# Patient Record
Sex: Female | Born: 1965 | Race: White | Hispanic: No | Marital: Married | Smoking: Never smoker
Health system: Southern US, Community
[De-identification: ages and names within clinical notes are randomized; demographics above are authoritative.]

## PROBLEM LIST (undated history)

## (undated) DIAGNOSIS — E7800 Pure hypercholesterolemia, unspecified: Secondary | ICD-10-CM

## (undated) DIAGNOSIS — I059 Rheumatic mitral valve disease, unspecified: Secondary | ICD-10-CM

## (undated) DIAGNOSIS — Z8619 Personal history of other infectious and parasitic diseases: Secondary | ICD-10-CM

## (undated) DIAGNOSIS — N39498 Other specified urinary incontinence: Secondary | ICD-10-CM

## (undated) HISTORY — PX: INDUCED ABORTION: SHX677

## (undated) HISTORY — DX: Rheumatic mitral valve disease, unspecified: I05.9

## (undated) HISTORY — PX: OTHER SURGICAL HISTORY: SHX169

## (undated) HISTORY — DX: Pure hypercholesterolemia, unspecified: E78.00

## (undated) HISTORY — PX: COLONOSCOPY: SHX174

## (undated) HISTORY — DX: Other specified urinary incontinence: N39.498

## (undated) HISTORY — DX: Personal history of other infectious and parasitic diseases: Z86.19

---

## 2009-02-09 ENCOUNTER — Ambulatory Visit: Payer: Self-pay | Admitting: Family Medicine

## 2009-02-09 DIAGNOSIS — Z9189 Other specified personal risk factors, not elsewhere classified: Secondary | ICD-10-CM | POA: Insufficient documentation

## 2009-02-09 DIAGNOSIS — I059 Rheumatic mitral valve disease, unspecified: Secondary | ICD-10-CM | POA: Insufficient documentation

## 2009-02-09 DIAGNOSIS — E7800 Pure hypercholesterolemia, unspecified: Secondary | ICD-10-CM | POA: Insufficient documentation

## 2009-02-09 DIAGNOSIS — R5381 Other malaise: Secondary | ICD-10-CM | POA: Insufficient documentation

## 2009-02-09 DIAGNOSIS — N39498 Other specified urinary incontinence: Secondary | ICD-10-CM | POA: Insufficient documentation

## 2009-02-09 DIAGNOSIS — R5383 Other fatigue: Secondary | ICD-10-CM | POA: Insufficient documentation

## 2009-02-11 ENCOUNTER — Ambulatory Visit: Payer: Self-pay | Admitting: Family Medicine

## 2009-02-12 LAB — CONVERTED CEMR LAB
ALT: 12 units/L (ref 0–35)
AST: 14 units/L (ref 0–37)
Albumin: 4.2 g/dL (ref 3.5–5.2)
Alkaline Phosphatase: 56 units/L (ref 39–117)
Bilirubin, Direct: 0 mg/dL (ref 0.0–0.3)
CO2: 24 meq/L (ref 19–32)
Calcium: 9.1 mg/dL (ref 8.4–10.5)
Chloride: 105 meq/L (ref 96–112)
Glucose, Bld: 86 mg/dL (ref 70–99)
HDL: 45.3 mg/dL (ref >39.00)
Sodium: 141 meq/L (ref 135–145)
Total CHOL/HDL Ratio: 3
Total Protein: 7 g/dL (ref 6.0–8.3)

## 2009-03-09 ENCOUNTER — Telehealth: Payer: Self-pay | Admitting: Family Medicine

## 2009-05-17 ENCOUNTER — Emergency Department: Payer: Self-pay | Admitting: Emergency Medicine

## 2009-05-20 ENCOUNTER — Encounter: Admission: RE | Admit: 2009-05-20 | Discharge: 2009-05-20 | Payer: Self-pay | Admitting: Family Medicine

## 2009-05-20 ENCOUNTER — Ambulatory Visit: Payer: Self-pay | Admitting: Family Medicine

## 2009-05-20 ENCOUNTER — Telehealth: Payer: Self-pay | Admitting: Family Medicine

## 2009-05-20 DIAGNOSIS — IMO0002 Reserved for concepts with insufficient information to code with codable children: Secondary | ICD-10-CM | POA: Insufficient documentation

## 2009-05-20 LAB — CONVERTED CEMR LAB
Basophils Absolute: 0.1 10*3/uL (ref 0.0–0.1)
Basophils Relative: 0.6 % (ref 0.0–3.0)
Eosinophils Absolute: 0.1 10*3/uL (ref 0.0–0.7)
Lymphocytes Relative: 27.6 % (ref 12.0–46.0)
MCHC: 33.4 g/dL (ref 30.0–36.0)
MCV: 88.8 fL (ref 78.0–100.0)
Monocytes Absolute: 0.6 10*3/uL (ref 0.1–1.0)
Neutrophils Relative %: 65.8 % (ref 43.0–77.0)
Platelets: 239 10*3/uL (ref 150.0–400.0)
RDW: 13.2 % (ref 11.5–14.6)

## 2009-05-21 ENCOUNTER — Encounter: Payer: Self-pay | Admitting: Family Medicine

## 2009-05-21 ENCOUNTER — Telehealth: Payer: Self-pay | Admitting: Family Medicine

## 2009-05-28 ENCOUNTER — Ambulatory Visit: Payer: Self-pay | Admitting: Family Medicine

## 2009-05-29 ENCOUNTER — Telehealth: Payer: Self-pay | Admitting: Family Medicine

## 2009-06-04 ENCOUNTER — Telehealth: Payer: Self-pay | Admitting: Family Medicine

## 2009-07-22 ENCOUNTER — Ambulatory Visit: Payer: Self-pay | Admitting: Family Medicine

## 2009-07-22 DIAGNOSIS — M25529 Pain in unspecified elbow: Secondary | ICD-10-CM | POA: Insufficient documentation

## 2009-07-22 LAB — CONVERTED CEMR LAB
BUN: 15 mg/dL (ref 6–23)
Basophils Relative: 0.3 % (ref 0.0–3.0)
Chloride: 109 meq/L (ref 96–112)
Eosinophils Relative: 0.7 % (ref 0.0–5.0)
GFR calc non Af Amer: 82.49 mL/min (ref >60)
HCT: 40.1 % (ref 36.0–46.0)
Lymphs Abs: 2.9 10*3/uL (ref 0.7–4.0)
Monocytes Relative: 5.6 % (ref 3.0–12.0)
Neutrophils Relative %: 60 % (ref 43.0–77.0)
Platelets: 211 10*3/uL (ref 150.0–400.0)
Potassium: 3.9 meq/L (ref 3.5–5.1)
RBC: 4.53 M/uL (ref 3.87–5.11)
Sed Rate: 24 mm/hr — ABNORMAL HIGH (ref 0–22)
WBC: 8.7 10*3/uL (ref 4.5–10.5)

## 2009-07-29 ENCOUNTER — Encounter: Admission: RE | Admit: 2009-07-29 | Discharge: 2009-07-29 | Payer: Self-pay | Admitting: Family Medicine

## 2009-07-30 DIAGNOSIS — M7020 Olecranon bursitis, unspecified elbow: Secondary | ICD-10-CM | POA: Insufficient documentation

## 2009-08-03 ENCOUNTER — Telehealth: Payer: Self-pay | Admitting: Family Medicine

## 2009-08-12 ENCOUNTER — Encounter: Payer: Self-pay | Admitting: Family Medicine

## 2009-09-03 ENCOUNTER — Telehealth: Payer: Self-pay | Admitting: Family Medicine

## 2009-09-14 ENCOUNTER — Ambulatory Visit: Payer: Self-pay | Admitting: Family Medicine

## 2009-09-14 ENCOUNTER — Encounter: Payer: Self-pay | Admitting: Family Medicine

## 2009-11-23 ENCOUNTER — Encounter: Payer: Self-pay | Admitting: Family Medicine

## 2009-12-03 ENCOUNTER — Telehealth: Payer: Self-pay | Admitting: Family Medicine

## 2010-01-01 ENCOUNTER — Ambulatory Visit: Payer: Self-pay | Admitting: Family Medicine

## 2010-01-01 DIAGNOSIS — R1011 Right upper quadrant pain: Secondary | ICD-10-CM | POA: Insufficient documentation

## 2010-01-01 LAB — CONVERTED CEMR LAB
Albumin: 4.2 g/dL (ref 3.5–5.2)
Basophils Relative: 0.4 % (ref 0.0–3.0)
CO2: 28 meq/L (ref 19–32)
Chloride: 102 meq/L (ref 96–112)
Eosinophils Absolute: 0.1 10*3/uL (ref 0.0–0.7)
Glucose, Bld: 78 mg/dL (ref 70–99)
Hemoglobin: 13.4 g/dL (ref 12.0–15.0)
Lipase: 32 units/L (ref 11.0–59.0)
Lymphs Abs: 3.1 10*3/uL (ref 0.7–4.0)
MCHC: 33.9 g/dL (ref 30.0–36.0)
MCV: 86.3 fL (ref 78.0–100.0)
Monocytes Absolute: 0.6 10*3/uL (ref 0.1–1.0)
Neutro Abs: 5.1 10*3/uL (ref 1.4–7.7)
RBC: 4.56 M/uL (ref 3.87–5.11)
Sodium: 136 meq/L (ref 135–145)
Total Protein: 7 g/dL (ref 6.0–8.3)

## 2010-01-05 ENCOUNTER — Encounter: Admission: RE | Admit: 2010-01-05 | Discharge: 2010-01-05 | Payer: Self-pay | Admitting: Family Medicine

## 2010-01-12 ENCOUNTER — Telehealth: Payer: Self-pay | Admitting: Family Medicine

## 2010-01-21 ENCOUNTER — Telehealth: Payer: Self-pay | Admitting: Family Medicine

## 2010-01-28 ENCOUNTER — Ambulatory Visit: Payer: Self-pay | Admitting: Cardiology

## 2010-03-15 ENCOUNTER — Ambulatory Visit: Payer: Self-pay | Admitting: Family Medicine

## 2010-03-15 DIAGNOSIS — R05 Cough: Secondary | ICD-10-CM

## 2010-03-15 DIAGNOSIS — R059 Cough, unspecified: Secondary | ICD-10-CM | POA: Insufficient documentation

## 2010-06-01 NOTE — Progress Notes (Signed)
Summary: Face and small area on chest flushed & red  Phone Note Call from Patient Call back at [phone]   Caller: Patient Call For: Dr. Patsy Lager Summary of Call: Pt saw Dr. Ermalene Searing on 05/20/09 for cellulitis of left elbow. Pt was given rocephin injection while here and then pt got Bactrim rx filled and since yesterday has taken a total of 6 Bactrim pills.(not at the same time). This morning pt said her face and small area on chest is flushed and appears red with no hives or rash and no itching but pt wonders if could be reaction to one of the meds. Pt states not having trouble breathing or SOB. Pt said the left elbow appears to be improving. Please advise.  Pt uses [facility] on [address] if pharmacy needed. Initial call taken by: Lewanda Rife LPN,  May 21, 2009 11:20 AM  Follow-up for Phone Call        possible to bactrim  change to below  call in Follow-up by: Hannah Beat MD,  May 21, 2009 11:25 AM  Additional Follow-up for Phone Call Additional follow up Details #1::        Patient advised.Consuello Masse CMA  Additional Follow-up by: Benny Lennert CMA Duncan Dull),  May 21, 2009 11:41 AM    New/Updated Medications: DOXYCYCLINE HYCLATE 100 MG CAPS (DOXYCYCLINE HYCLATE) Take 1 tab twice a day Prescriptions: DOXYCYCLINE HYCLATE 100 MG CAPS (DOXYCYCLINE HYCLATE) Take 1 tab twice a day  #20 x 0   Entered by:   Benny Lennert CMA (AAMA)   Authorized by:   Hannah Beat MD   Signed by:   Benny Lennert CMA (AAMA) on 05/21/2009   Method used:   Electronically to        [facility] [address] [phone]* (retail)       [address] [place]  [address]       Ph: [phone]       Fax: [phone]   RxID:   [id number]

## 2010-06-01 NOTE — Progress Notes (Signed)
Summary: When to start Bactrim DS  Phone Note Call from Patient Call back at [phone]   Caller: Patient Call For: Dr. Ermalene Searing Summary of Call: Pt saw Dr. Ermalene Searing this morning. Pt had taken an antibiotic at 9:oo am today. Was given an antibiotic injection while at office today. Pt wonders when should she start the prescription of Bactrim DS since she had taken antibiotic orally this AM and got antibiotic injection. Please advise.  Initial call taken by: Lewanda Rife LPN,  May 20, 2009 2:29 PM  Follow-up for Phone Call        patient advised that dr Ermalene Searing wanted her to go ahead and start med now Follow-up by: Benny Lennert CMA Duncan Dull),  May 20, 2009 2:31 PM

## 2010-06-01 NOTE — Progress Notes (Signed)
Summary: yeast infection   Phone Note Call from Patient Call back at Home Phone [phone]   Caller: Patient Call For: Hannah Beat MD Summary of Call: Patient says that she has just finished up with her antibiotics for her cellulitis and now has yeast infection. Wants to know if she needs to take something OTC or if something can be called in for her to [facility]. Please advise. Initial call taken by: Melody Comas,  June 04, 2009 11:27 AM  Follow-up for Phone Call        some is her preference. most of the otc creams will work 99% of the time.   if she wants to use creams, Monistat works fine  if she prefers oral med, ok to call in diflucan 150 mg, 1 by mouth x 1, 1 refill Follow-up by: Hannah Beat MD,  June 04, 2009 1:20 PM  Additional Follow-up for Phone Call Additional follow up Details #1::        Rx Called In, patient notified and prefered the pill instead of the creams Additional Follow-up by: Linde Gillis CMA Duncan Dull),  June 04, 2009 3:06 PM    New/Updated Medications: DIFLUCAN 150 MG TABS (FLUCONAZOLE) take one tablet by mouth as directed  Prior Medications: LIPITOR 10 MG TABS (ATORVASTATIN CALCIUM) take one a day CEPHALEXIN 500 MG CAPS (CEPHALEXIN) 1 by mouth 4 times daily DOXYCYCLINE HYCLATE 100 MG CAPS (DOXYCYCLINE HYCLATE) Take 1 tab twice a day Current Allergies: No known allergies

## 2010-06-01 NOTE — Assessment & Plan Note (Signed)
Summary: 10:00 ?GALLBLADDER/CLE   Vital Signs:  Patient profile:   45 year old female Height:      68 inches Weight:      188.0 pounds BMI:     28.69 Temp:     97.6 degrees F oral Pulse rate:   76 / minute Pulse rhythm:   regular BP sitting:   118 / 80  (left arm) Cuff size:   regular  Vitals Entered By: Benny Lennert CMA Duncan Dull) (January 01, 2010 10:07 AM)  History of Present Illness: Chief complaint ? gallbladder  9-10 days ago began having abdominal pain Dull pain, continuous right upper quadrant. No sharp apin. 7/10...keeping her up at night.  No change with movement, no change with eating, defecation. No pain with riding in car.  Recetn GYN exam...has fibroids. HAs hx of umbilical hernia, inrepaired.  Problems Prior to Update: 1)  Unspecified Breast Screening  (ICD-V76.10) 2)  Olecranon Bursitis  (ICD-726.33) 3)  Elbow Pain, Left  (ICD-719.42) 4)  Cellulitis and Abscess of Upper Arm and Forearm  (ICD-682.3) 5)  Screening For Diabetes Mellitus  (ICD-V77.1) 6)  Fatigue  (ICD-780.79) 7)  Family History of Colon Ca 1st Degree Relative <60  (ICD-V16.0) 8)  Mitral Valve Prolapse  (ICD-424.0) 9)  Stress Incontinence  (ICD-788.39) 10)  Hypercholesterolemia  (ICD-272.0) 11)  Chickenpox, Hx of  (ICD-V15.9)  Current Medications (verified): 1)  Lipitor 10 Mg Tabs (Atorvastatin Calcium) .... Take One A Day  Allergies (verified): No Known Drug Allergies  Past History:  Past medical, surgical, family and social histories (including risk factors) reviewed, and no changes noted (except as noted below).  Past Medical History: Reviewed history from 02/09/2009 and no changes required. MITRAL VALVE PROLAPSE (ICD-424.0) STRESS INCONTINENCE (ICD-788.39) HYPERCHOLESTEROLEMIA (ICD-272.0) CHICKENPOX, HX OF (ICD-V15.9)    Past Surgical History: Reviewed history from 02/09/2009 and no changes required. 2 c-sections Abortions x 2   Family History: Reviewed history from  02/09/2009 and no changes required. Family History of Colon CA 1st degree relative <60 Family History High cholesterol Family History of Cardiovascular disorder  F, CAD in 24's, now alive in 45's M: alive, HTN MGF: died from Melanoma  Social History: Reviewed history from 02/09/2009 and no changes required. 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 CV:  Denies chest pain or discomfort. Resp:  Denies shortness of breath. GI:  Complains of diarrhea; denies bloody stools, constipation, gas, hemorrhoids, indigestion, and nausea; one episode of diarrhea after ice cream in last few days. GU:  Denies abnormal vaginal bleeding and dysuria.  Physical Exam  General:  Well-developed,well-nourished,in no acute distress; alert,appropriate and cooperative throughout examination Mouth:  Oral mucosa and oropharynx without lesions or exudates.  Teeth in good repair. Neck:  no carotid bruit or thyromegaly no cervical or supraclavicular lymphadenopathy  Lungs:  Normal respiratory effort, chest expands symmetrically. Lungs are clear to auscultation, no crackles or wheezes. Heart:  Normal rate and regular rhythm. S1 and S2 normal without gallop, murmur, click, rub or other extra sounds. Abdomen:  ttp in RUQ, mild, no rebound no guarding. small umbilical hernia, enlarged uterus, firm, tender to palpation central low abdomen chronic NABSno hepatomegaly and no splenomegaly.     Impression & Recommendations:  Problem # 1:  ABDOMINAL PAIN, RIGHT UPPER QUADRANT (ICD-789.01) Possible liver issue versus gallbladder source. Pt is on statin. Eval with labs and [place].  Orders: TLB-BMP (Basic Metabolic  Panel-BMET) (80048-METABOL) TLB-CBC Platelet - w/Differential (85025-CBCD) TLB-Hepatic/Liver Function Pnl (80076-HEPATIC) TLB-Lipase (83690-LIPASE) Radiology Referral  (Radiology)  Complete Medication List: 1)  Lipitor 10 Mg Tabs (Atorvastatin calcium) .... Take one a day  Patient Instructions: 1)  Referral Appointment Information 2)  Day/Date: 3)  Time: 4)  Place/MD: 5)  Address: 6)  Phone/Fax: 7)  Patient given appointment information. Information/Orders faxed/mailed. 8)  Go to ER if severe pain, fever, uncontrollable N/V.  Current Allergies (reviewed today): No known allergies

## 2010-06-01 NOTE — Progress Notes (Signed)
Summary: mammogram  Phone Note Call from Patient Call back at Home Phone [phone]   Caller: Patient Call For: Hannah Beat MD Summary of Call: Patient calling wanting mammogram order to  [facility] [phone]  Patient would like this done in the am if possible with in the next 2 weeks Initial call taken by: Benny Lennert CMA Duncan Dull),  Sep 03, 2009 11:33 AM     Appended Document: mammogram    Clinical Lists Changes  Problems: Added new problem of UNSPECIFIED BREAST SCREENING (ICD-V76.10) Orders: Added new Referral order of Radiology Referral (Radiology) - Signed

## 2010-06-01 NOTE — Progress Notes (Signed)
Summary: Stomach burning  Phone Note Call from Patient Call back at Home Phone [phone]   Caller: Patient Call For: Hannah Beat MD Summary of Call: Patient called stating she saw Dr. Patsy Lager yesterday and was treated for Cellulitis in her left elbow.  She is still having pain in that elbow along with a lot of stomach burning.  She thinks that the Doxycycline 100mg  twice daily and the Cephalexin 500mg  four times daily is whats causing her stomach to feel like its burning.  Wants nurse to call her back.     Initial call taken by: Linde Gillis CMA Duncan Dull),  May 29, 2009 9:31 AM  Follow-up for Phone Call        Patient called back and wants to know what to do.  She is concerned that after she takes Cephalexin 500mg  for her first dose today that her stomach will start burning again.  She is very anxious and really wants a call back as soon as possible.  Please advise. Follow-up by: Linde Gillis CMA Duncan Dull),  May 29, 2009 12:01 PM  Additional Follow-up for Phone Call Additional follow up Details #1::        Let pt know if nausea..can call in antiemetic. Can take OTC prilosec 40 mg daily to decrease acid in stomach and help with stomach irritation.  Additional Follow-up by: Kerby Nora MD,  May 29, 2009 12:22 PM    Additional Follow-up for Phone Call Additional follow up Details #2::    Left message on patient's voicemail per patient's request, advised as instructed. Follow-up by: Linde Gillis CMA Duncan Dull),  May 29, 2009 12:28 PM

## 2010-06-01 NOTE — Assessment & Plan Note (Signed)
Summary: F/U CELLULITIS/CLE   Vital Signs:  Patient profile:   45 year old female Height:      68 inches Weight:      188.2 pounds BMI:     28.72 Temp:     98.1 degrees F oral Pulse rate:   76 / minute Pulse rhythm:   regular BP sitting:   110 / 60  (left arm) Cuff size:   regular  Vitals Entered By: Benny Lennert CMA Duncan Dull) (May 28, 2009 8:24 AM)  History of Present Illness: Chief complaint follow up cellulitis  pleasant 45 year old female well known to me he follows up the left elbow cellulitis. She initially went to the emergency room and was placed on some Septra DS, subtly followed up with Dr. Ermalene Searing was placed on Keflex 500 mg p.o. 4 times daily.  She ended up developing some rash, so we discontinued her Septra, and continued oral doxycycline. She has had a dramatic improvement in her cellulitis and elbow pain. She still has some pain with terminal motion in extreme flexion and terminal supination pronation, she has no palpable like for non-bursitis currently. There is minimal puffiness.  Both historical note, proximally 4 months ago she had a left-sided olecranon bursitis, [facility], had her elbow aspirated and injected with corticosteroid. She had no immediate infectious response status post injection. She did have some persistent olecranon bursitis after this however.  Allergies (verified): No Known Drug Allergies  Past History:  Past medical, surgical, family and social histories (including risk factors) reviewed, and no changes noted (except as noted below).  Past Medical History: Reviewed history from 02/09/2009 and no changes required. MITRAL VALVE PROLAPSE (ICD-424.0) STRESS INCONTINENCE (ICD-788.39) HYPERCHOLESTEROLEMIA (ICD-272.0) CHICKENPOX, HX OF (ICD-V15.9)    Past Surgical History: Reviewed history from 02/09/2009 and no changes required. 2 c-sections Abortions x 2   Family History: Reviewed history from  02/09/2009 and no changes required. Family History of Colon CA 1st degree relative <60 Family History High cholesterol Family History of Cardiovascular disorder  F, CAD in 81's, now alive in 25's M: alive, HTN MGF: died from Melanoma  Social History: Reviewed history from 02/09/2009 and no changes required. 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 CV: No chest pain or SOB GI: No noted N or V skin much better Otherwise, pertinent positives and negatives are noted in the HPI.  MS:  See HPI.  Physical Exam  General:  GEN: Well-developed,well-nourished,in no acute distress; alert,appropriate and cooperative throughout examination HEENT: Normocephalic and atraumatic without obvious abnormalities. No apparent alopecia or balding. Ears, externally no deformities PULM: Breathing comfortably in no respiratory distress EXT: No clubbing, cyanosis, or edema PSYCH: Normally interactive. Cooperative during the interview. Pleasant. Friendly and conversant. Not anxious or depressed appearing. Normal, full affect.  Msk:  left elbow is not read, there is no significant olecranon bursitis. She has a full range of motion. There is no redness or warmth surrounding the joint. Still has some mild pain with percussion of the olecranon. Mild to minimal pain with terminal pronation and supination.   Impression & Recommendations:  Problem # 1:  CELLULITIS AND ABSCESS OF UPPER ARM AND FOREARM (ICD-682.3) Assessment Improved dramatically improved. Given location of infection, and some mild pain, and then extend her antibiotics. I think this is most likely persistent mild postinflammatory state around the  elbow, however given potential morbidity for persistent infection will extend abx.  The following medications were removed from the medication  list:    Keflex 500 Mg Caps (Cephalexin) .Marland Kitchen... Take one tablet 4 times daily for 10 days Her updated medication list for this problem includes:    Doxycycline Hyclate 100 Mg Caps (Doxycycline hyclate) .Marland Kitchen... Take 1 tab twice a day    Cephalexin 500 Mg Caps (Cephalexin) .Marland Kitchen... 1 by mouth 4 times daily  Complete Medication List: 1)  Lipitor 10 Mg Tabs (Atorvastatin calcium) .... Take one a day 2)  Doxycycline Hyclate 100 Mg Caps (Doxycycline hyclate) .... Take 1 tab twice a day 3)  Cephalexin 500 Mg Caps (Cephalexin) .Marland Kitchen.. 1 by mouth 4 times daily 4)  Doxycycline Hyclate 100 Mg Caps (Doxycycline hyclate) .... Take 1 tab twice a day Prescriptions: DOXYCYCLINE HYCLATE 100 MG CAPS (DOXYCYCLINE HYCLATE) Take 1 tab twice a day  #20 x 0   Entered and Authorized by:   Hannah Beat MD   Signed by:   Hannah Beat MD on 05/28/2009   Method used:   Electronically to        [facility] [address] [phone]* (retail)       [address] [place], [place]  [address]       Ph: [phone]       Fax: [phone]   RxID:   [id number] CEPHALEXIN 500 MG CAPS (CEPHALEXIN) 1 by mouth 4 times daily  #40 x 0   Entered and Authorized by:   Hannah Beat MD   Signed by:   Hannah Beat MD on 05/28/2009   Method used:   Electronically to        [facility] [address] [phone]* (retail)       [address] [place]  [address]       Ph: [phone]       Fax: [phone]   RxID:   [id number]   Current Allergies (reviewed today): No known allergies

## 2010-06-01 NOTE — Consult Note (Signed)
Summary: [facility]  [facility]   Imported By: Lanelle Bal 09/16/2009 12:09:08  _____________________________________________________________________  External Attachment:    Type:   Image     Comment:   External Document

## 2010-06-01 NOTE — Progress Notes (Signed)
Summary: ? about medications  Phone Note Call from Patient Call back at Home Phone [phone]   Caller: Patient Call For: Hannah Beat MD Summary of Call: Patient called and wants a return call.  Has several questions about what medications she has already taken this morning, and the new medication that Dr. Ermalene Searing prescribed for her.   Initial call taken by: Linde Gillis CMA Duncan Dull),  May 20, 2009 12:46 PM  Follow-up for Phone Call        Sorry..made mistake..resent as generic # 40  Directions correct. Please let pharm know.   Please call pt to find out what her questions are.  Follow-up by: Kerby Nora MD,  May 20, 2009 12:56 PM  Additional Follow-up for Phone Call Additional follow up Details #1::        Patient advised.Consuello Masse CMA  Additional Follow-up by: Benny Lennert CMA Duncan Dull),  May 20, 2009 2:19 PM    New/Updated Medications: SULFAMETHOXAZOLE-TMP DS 800-160 MG TABS (SULFAMETHOXAZOLE-TRIMETHOPRIM) 2 tab by mouth two times a day x 10 days Prescriptions: SULFAMETHOXAZOLE-TMP DS 800-160 MG TABS (SULFAMETHOXAZOLE-TRIMETHOPRIM) 2 tab by mouth two times a day x 10 days  #40 x 0   Entered and Authorized by:   Kerby Nora MD   Signed by:   Kerby Nora MD on 05/20/2009   Method used:   Electronically to        [facility] [address] [phone]* (retail)       [address] [place]  [address]       Ph: [phone]       Fax: [phone]   RxID:   [id number]

## 2010-06-01 NOTE — Progress Notes (Signed)
Summary: requests diflucan  Phone Note Call from Patient Call back at Home Phone [phone]   Caller: Patient Summary of Call: Pt is going on vacation tomorrow and has a yeast infection.  She is asking that [facility] be [facility] [place].  She is asking please. Initial call taken by: Lowella Petties CMA,  December 03, 2009 8:12 AM  Follow-up for Phone Call        HAs she been on recetn antibitoics....if so i will send in diflucan.  if no...needs appt to have vaginal exam to make sure no other source of symptoms.  Follow-up by: Kerby Nora MD,  December 03, 2009 8:22 AM  Additional Follow-up for Phone Call Additional follow up Details #1::        patient was not on antibiotics but, she says that she changed her famine pads and the ones she changed to ones with fragrance and she said that is what cause it.Consuello Masse CMA   Additional Follow-up by: Benny Lennert CMA Duncan Dull),  December 03, 2009 9:42 AM    Additional Follow-up for Phone Call Additional follow up Details #2::    Dr. Patsy Lager has called in diflucan for her in past with sucess..Let her know I will do this once, but in furture she will need appt ...she needs to make an appt if it is not resolving.  Follow-up by: Kerby Nora MD,  December 03, 2009 11:01 AM  Additional Follow-up for Phone Call Additional follow up Details #3:: Details for Additional Follow-up Action Taken: Patient advised.Consuello Masse CMA    Additional Follow-up by: Benny Lennert CMA Duncan Dull),  December 03, 2009 11:03 AM  New/Updated Medications: FLUCONAZOLE 150 MG TABS (FLUCONAZOLE) 1 tab by mouth x 1 Prescriptions: FLUCONAZOLE 150 MG TABS (FLUCONAZOLE) 1 tab by mouth x 1  #1 x 0   Entered and Authorized by:   Kerby Nora MD   Signed by:   Kerby Nora MD on 12/03/2009   Method used:   Electronically to        [facility] [address] [phone]* (retail)       [address], [place]  [address]       Ph: [phone]       Fax:  [phone]   RxID:   [id number]   Appended Document: requests diflucan Pt called back, said she had a bad connection on her call with Herbert Seta and she lost the call, called back to see if medicine was going to be called in- advised pt that medicine had been called in and advised her of instructions.

## 2010-06-01 NOTE — Progress Notes (Signed)
Summary: ct scan   Phone Note Call from Patient Call back at Home Phone [phone]   Caller: Patient Call For: Kerby Nora MD Summary of Call: Patient wants to go ahead with the CT to check the liver on the lesion. Initial call taken by: Benny Lennert CMA Duncan Dull),  January 21, 2010 10:10 AM  Follow-up for Phone Call        CT Scan appt at [facility] on 01/28/2010 at 10:00am.   Follow-up by: Carlton Adam,  January 22, 2010 12:03 PM  New Problems: ? of HEPATIC CYST (ICD-573.8)   New Problems: ? of HEPATIC CYST (ICD-573.8)

## 2010-06-01 NOTE — Assessment & Plan Note (Signed)
Summary: COUGH/DLO   Vital Signs:  Patient profile:   45 year old female Height:      68 inches Weight:      179.50 pounds BMI:     27.39 O2 Sat:      96 % on Room air Temp:     98.6 degrees F oral Pulse rate:   80 / minute Pulse rhythm:   regular Resp:     16 per minute BP sitting:   102 / 80  (left arm) Cuff size:   regular  Vitals Entered By: Delilah Shan CMA Duncan Dull) (March 15, 2010 9:09 AM)  O2 Flow:  Room air CC: Cough, chest congestion   History of Present Illness: Cough.  "Really bad cough, persistent."  Delsym helps some.  No URI symptoms.  No ST.  Pain with cough.  pain with deep breath.  Started about 12 days ago.  Occ sputum in AM but then it clears up and she has a dry cough.  Hurts to laugh.  Sleep affected.  Sleep throughout the day and night.  No documented fevers now.  Occ exp wheeze, new for patient.    Allergies: No Known Drug Allergies  Review of Systems       See HPI.  Otherwise negative.    Physical Exam  General:  NAD ncat  mmm tm wnl b  nasal exam w/o acute change rrr no in in wob, exp wheeze w/o decrease in bs, ant chest wall tender to palpation  ext w/o edema   Impression & Recommendations:  Problem # 1:  COUGH (ICD-786.2) Likely postviral cough.  Nontoxic. d/w patient and okay for outpatient follow up.  Use SABA as needed in meantime.  Should gradually improve.  She understood.  no indication for antibiotics. SABA use reviewed with patient.   Complete Medication List: 1)  Lipitor 10 Mg Tabs (Atorvastatin calcium) .... Take one a day 2)  Proair Hfa 108 (90 Base) Mcg/act Aers (Albuterol sulfate) .Marland Kitchen.. 1-2 puffs q4hours as needed for cough  Patient Instructions: 1)  Try to get some rest, drink plenty of fluids and use the inhaler every 4 hours as needed for cough.  This should get better, but the change will likely be gradual.  Let us know if you have other concerns.   Prescriptions: PROAIR HFA 108 (90 BASE) MCG/ACT AERS (ALBUTEROL  SULFATE) 1-2 puffs q4hours as needed for cough  #1 x 2   Entered and Authorized by:   Crawford Givens MD   Signed by:   Crawford Givens MD on 03/15/2010   Method used:   Faxed to ...       [facility] (retail)       [address]       [place], [place]    [place]       Ph: [phone]       Fax: [phone]   RxID:   [id number] PROAIR HFA 108 [phone] BASE) MCG/ACT AERS (ALBUTEROL SULFATE) 1-2 puffs q4hours as needed for cough  #1 x 2   Entered and Authorized by:   Crawford Givens MD   Signed by:   Crawford Givens MD on 03/15/2010   Method used:   Print then Give to Patient   RxID:   [id number]    Orders Added: 1)  Est. Patient Level III [28[address]]    Current Allergies (reviewed today): No known allergies

## 2010-06-01 NOTE — Progress Notes (Signed)
Summary: regarding ultrasound results  Phone Note Call from Patient Call back at Home Phone [phone]   Caller: Patient Summary of Call: Pt is concerned about abd ultrasound results and she is asking what you recommend she do about the findings.  She says she is feeling much better with no more pain, but she is concerned about the spot on her kidney because her father had renal cancer.  Is this something that needs to be further checked now, or recheck in a few months? Initial call taken by: Lowella Petties CMA,  January 12, 2010 1:00 PM  Follow-up for Phone Call        Only thing on kideny was a benign appearing cyst. No worrisome features noted...no further work up needed.  Liver lesion...also begnign appearing...Marland Kitchenno further work up recommended given symptoms dod not correspond. If she is very worried/anxious we can eval liver lesion with CT now  (kidney lesion appears completely begnign) Follow-up by: Kerby Nora MD,  January 12, 2010 2:04 PM  Additional Follow-up for Phone Call Additional follow up Details #1::        Lmom.Consuello Masse CMA   Additional Follow-up by: Benny Lennert CMA Duncan Dull),  January 12, 2010 3:03 PM    Additional Follow-up for Phone Call Additional follow up Details #2::    Patient notified and is going to wait and watch for a year then may do something else.Consuello Masse CMA   Follow-up by: Benny Lennert CMA Duncan Dull),  January 12, 2010 3:10 PM

## 2010-06-01 NOTE — Assessment & Plan Note (Signed)
Summary: L ELBOW PAIN/CLE   Vital Signs:  Patient profile:   45 year old female Height:      68 inches Weight:      183.3 pounds BMI:     27.97 Temp:     98.4 degrees F oral Pulse rate:   76 / minute Pulse rhythm:   regular BP sitting:   120 / 78  (left arm) Cuff size:   regular  Vitals Entered By: Benny Lennert CMA Duncan Dull) (July 22, 2009 9:10 AM) CC: LEFT ELBOW PAIN   History of Present Illness: Chief complaint follow up cellulitis  pleasant 45 year old female well known to me he follows up the left elbow cellulitis. She initially went to the emergency room and was placed on some Septra DS, subtly followed up with Dr. Ermalene Searing was placed on Keflex 500 mg p.o. 4 times daily.  She ended up developing some rash, so we discontinued her Septra, and continued oral doxycycline.   she had negative plain x-rays, and did have an exam and x-rays consistent with olecranon bursitis. There is likely and secondary cellulitis, however the patient improved after Rocephin injection, Keflex, and then an extended course of Keflex and doxycycline  prior to this she had a left-sided olecranon bursitis, was seen at orthopedic, had her elbow aspirated and injected with corticosteroid. She had no immediate infectious response status post injection. She did have some persistent olecranon bursitis after this however.  Allergies (verified): No Known Drug Allergies  Past History:  Past medical, surgical, family and social histories (including risk factors) reviewed, and no changes noted (except as noted below).  Past Medical History: Reviewed history from 02/09/2009 and no changes required. MITRAL VALVE PROLAPSE (ICD-424.0) STRESS INCONTINENCE (ICD-788.39) HYPERCHOLESTEROLEMIA (ICD-272.0) CHICKENPOX, HX OF (ICD-V15.9)    Past Surgical History: Reviewed history from 02/09/2009 and no changes required. 2 c-sections Abortions x 2   Family History: Reviewed history from 02/09/2009 and no changes  required. Family History of Colon CA 1st degree relative <60 Family History High cholesterol Family History of Cardiovascular disorder  F, CAD in 48's, now alive in 48's M: alive, HTN MGF: died from Melanoma  Social History: Reviewed history from 02/09/2009 and no changes required. 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 Otherwise the pertinent positives of the ROS are noted above.    Physical Exam  General:  GEN: Well-developed,well-nourished,in no acute distress; alert,appropriate and cooperative throughout examination HEENT: Normocephalic and atraumatic without obvious abnormalities. No apparent alopecia or balding. Ears, externally no deformities PULM: Breathing comfortably in no respiratory distress EXT: No clubbing, cyanosis, or edema PSYCH: Normally interactive. Cooperative during the interview. Pleasant. Friendly and conversant. Not anxious or depressed appearing. Normal, full affect.    Shoulder/Elbow Exam  Skin:    Intact, no scars, lesions, rashes  Inspection:    slightly prominent olecranon, however the bursa is not boggy  Palpation:    minimally tender with percussion  Elbow Exam:    Right:    Inspection:  Normal    Palpation:  Normal    Stability:  stable    Tenderness:  no    Swelling:  no    Erythema:  no    Range of Motion:  Flexion-Active: 135       Extension-Active: 0       Flexion-Passive: 135       Extension-Passive: 0       Elbow Flexion: > 60 seconds    Left:    Inspection:  Normal    Palpation:  Normal    Stability:  stable    Tenderness:  left olecranon    Swelling:  no    Erythema:  no    Range  of Motion:       Flexion-Active: 135       Extension-Active: 0       Flexion-Passive: 135       Extension-Passive: 0       Elbow Flexion: > 60 seconds   Impression & Recommendations:  Problem # 1:  ELBOW PAIN, LEFT (ICD-719.42) Assessment Deteriorated history of protracted course with 2 extended courses of antibodies with cellulitis adjacent to the elbow and bursa. There does not appear to be any active cellulitis or bursitis currently clinically. However, patient does have some mild pain with terminal flexion, supination, and pronation.  I noted obtain an MRI with and without gadolinium to evaluate for potential osteomyelitis given these infectious processes. If this is the case, then evaluation by orthopedics and infectious disease is indicated.  This could be complex regional pain syndrome , reflex sympathetic dystrophy  Orders: Venipuncture (56213) TLB-BMP (Basic Metabolic Panel-BMET) (80048-METABOL) TLB-CBC Platelet - w/Differential (85025-CBCD) TLB-Sedimentation Rate (ESR) (85652-ESR) Radiology Referral (Radiology)  Problem # 2:  CELLULITIS AND ABSCESS OF UPPER ARM AND FOREARM (ICD-682.3) Assessment: Improved  The following medications were removed from the medication list:    Cephalexin 500 Mg Caps (Cephalexin) .Marland Kitchen... 1 by mouth 4 times daily  Orders: Venipuncture (08657) TLB-BMP (Basic Metabolic Panel-BMET) (80048-METABOL) TLB-CBC Platelet - w/Differential (85025-CBCD) TLB-Sedimentation Rate (ESR) (85652-ESR)  Complete Medication List: 1)  Lipitor 10 Mg Tabs (Atorvastatin calcium) .... Take one a day 2)  Lorazepam 0.5 Mg Tabs (Lorazepam) .Marland Kitchen.. 1 tab by mouth 30 minutes before mri, may repeat x 1 if needed  Patient Instructions: 1)  Referral Appointment Information 2)  Day/Date: 3)  Time: 4)  Place/MD: 5)  Address: 6)  Phone/Fax: 7)  Patient given appointment information. Information/Orders faxed/mailed.  Prescriptions: LORAZEPAM 0.5 MG TABS (LORAZEPAM) 1 tab by  mouth 30 minutes before MRI, may repeat x 1 if needed  #2 x 0   Entered and Authorized by:   Hannah Beat MD   Signed by:   Hannah Beat MD on 07/22/2009   Method used:   Print then Give to Patient   RxID:   [id number]   Current Allergies (reviewed today): No known allergies

## 2010-06-01 NOTE — Progress Notes (Signed)
Summary: regarding MRI report  Phone Note Call from Patient Call back at Home Phone [phone]   Caller: Patient Call For: Hannah Beat MD Summary of Call: Pt is going to see an ortho on 4/13 and she wants to make sure her MRI report will be there when she goes.  Do we need to send that?  Will she need to take the films?  How does that work? Initial call taken by: Lowella Petties CMA,  August 03, 2009 3:38 PM  Follow-up for Phone Call        they have access to films through computer Follow-up by: Hannah Beat MD,  August 03, 2009 3:49 PM  Additional Follow-up for Phone Call Additional follow up Details #1::        [facility] advising pt. Additional Follow-up by: Lowella Petties CMA,  August 03, 2009 4:34 PM

## 2010-06-01 NOTE — Letter (Signed)
Summary: [facility]   Imported By: Lanelle Bal 12/24/2009 08:37:08  _____________________________________________________________________  External Attachment:    Type:   Image     Comment:   External Document

## 2010-06-01 NOTE — Progress Notes (Signed)
Summary: Clarification on Bactrim  Phone Note From Pharmacy   Caller: [facility] [phone] Call For: Dr. Ermalene Searing  Summary of Call: Received call from pharmacy stating that they need clarification on Bactrim.  Do you want #40 tablets with directions being 2 tablets twice daily for 10 days, or would you like 1 tablet twice daily for 10 days #20? Initial call taken by: Linde Gillis CMA Duncan Dull),  May 20, 2009 11:46 AM  Follow-up for Phone Call        See other phone note. Correct is #40.  Follow-up by: Kerby Nora MD,  May 20, 2009 1:25 PM  Additional Follow-up for Phone Call Additional follow up Details #1::        all taken care of Additional Follow-up by: Benny Lennert CMA Duncan Dull),  May 20, 2009 2:20 PM

## 2010-06-01 NOTE — Progress Notes (Signed)
Summary: wants to change PCP's  Phone Note Call from Patient Call back at Home Phone [phone]   Caller: Patient Summary of Call: Pt would like to switch from Dr. Patsy Lager to Dr. Ermalene Searing as a PCP.  The only reason is that she prefers a female doctor.  She knows she will be seeing Dr. Patsy Lager in Dr. Daphine Deutscher absence. Initial call taken by: Lowella Petties CMA,  January 12, 2010 12:56 PM  Follow-up for Phone Call        Okay with me Follow-up by: Kerby Nora MD,  January 12, 2010 1:52 PM  Additional Follow-up for Phone Call Additional follow up Details #1::        OK with me, this is a very nice patient. I remember her well. Additional Follow-up by: Hannah Beat MD,  January 15, 2010 6:01 AM

## 2010-06-01 NOTE — Letter (Signed)
Summary: Records Dated 03-08-04 thru 07-21-08/Dixit MD  Records Dated 03-08-04 thru 07-21-08/Dixit MD   Imported By: Lanelle Bal 12/31/2009 12:58:34  _____________________________________________________________________  External Attachment:    Type:   Image     Comment:   External Document

## 2010-06-01 NOTE — Assessment & Plan Note (Signed)
Summary: CELLIS ON ELBOW... CYD   Vital Signs:  Patient profile:   45 year old female Height:      68 inches Weight:      186.4 pounds BMI:     28.44 Temp:     98.4 degrees F oral Pulse rate:   76 / minute Pulse rhythm:   regular BP sitting:   110 / 72  (left arm) Cuff size:   regular  Vitals Entered By: Benny Lennert CMA Duncan Dull) (May 20, 2009 9:45 AM)  History of Present Illness: Cc: elbow infection   4 months ago... saw ORTHo for left elbow pain and mild redness...thought bursitis.Marland Kitchengiven steroid injection. no improvement..did not follow up.  1 week ago pain increased, redness...saw Derm..no diagnosis made. Went to ER at [facility]...dx with cellulitis....started on keflex 4 times daily. Has taken 4 days worth. Since decreased pain 50% better,also less redness and swelling. No further spread. She is concerned that she is still havin pain....pain in joint intself, stiffness.  No fever. No flu like symtoms. Soreness in B groin..no axillary ttp No N/V.  No MRSA risk, exposure.   Problems Prior to Update: 1)  Cellulitis and Abscess of Upper Arm and Forearm  (ICD-682.3) 2)  Screening For Diabetes Mellitus  (ICD-V77.1) 3)  Fatigue  (ICD-780.79) 4)  Family History of Colon Ca 1st Degree Relative <60  (ICD-V16.0) 5)  Mitral Valve Prolapse  (ICD-424.0) 6)  Stress Incontinence  (ICD-788.39) 7)  Hypercholesterolemia  (ICD-272.0) 8)  Chickenpox, Hx of  (ICD-V15.9)  Current Medications (verified): 1)  Lipitor 10 Mg Tabs (Atorvastatin Calcium) .... Take One A Day 2)  Keflex 500 Mg Caps (Cephalexin) .... Take One Tablet 4 Times Daily For 10 Days 3)  Bactrim Ds 800-160 Mg Tabs (Sulfamethoxazole-Trimethoprim) .... 2 Tab By Mouth Two Times A Day X 10 Days  Allergies (verified): No Known Drug Allergies  Past History:  Past medical, surgical, family and social histories (including risk factors) reviewed, and no changes noted (except as noted below).  Past Medical  History: Reviewed history from 02/09/2009 and no changes required. MITRAL VALVE PROLAPSE (ICD-424.0) STRESS INCONTINENCE (ICD-788.39) HYPERCHOLESTEROLEMIA (ICD-272.0) CHICKENPOX, HX OF (ICD-V15.9)    Past Surgical History: Reviewed history from 02/09/2009 and no changes required. 2 c-sections Abortions x 2   Family History: Reviewed history from 02/09/2009 and no changes required. Family History of Colon CA 1st degree relative <60 Family History High cholesterol Family History of Cardiovascular disorder  F, CAD in 44's, now alive in 35's M: alive, HTN MGF: died from Melanoma  Social History: Reviewed history from 02/09/2009 and no changes required. 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 CV:  Denies chest pain or discomfort and swelling of feet. Resp:  Denies shortness of breath. GI:  Complains of abdominal pain; some B groin ache..no swollen lymph nodes. . GU:  Denies dysuria.  Physical Exam  General:  Well-developed,well-nourished,in no acute distress; alert,appropriate and cooperative throughout examination NON TOXIC APPEARING Mouth:  MMM Neck:  no carotid bruit or thyromegaly no cervical or supraclavicular lymphadenopathy  Lungs:  Normal respiratory effort, chest expands symmetrically. Lungs are clear to auscultation, no crackles or wheezes. Heart:  Normal rate and regular rhythm. S1 and S2 normal without gallop, murmur, click, rub or other extra sounds. Abdomen:  Bowel sounds positive,abdomen soft and non-tender without masses, organomegaly or hernias noted. Pulses:  R and L posterior tibial pulses are  full and equal bilaterally  Extremities:  no edema  Skin:  6 inchesx 2 iches of erythema at left elbow and forearm, pain to palpation, warmth, soreness in elbow with passive flexion/extension Cervical Nodes:  No lymphadenopathy  noted Axillary Nodes:  No palpable lymphadenopathy Inguinal Nodes:  No significant adenopathy   Impression & Recommendations:  Problem # 1:  CELLULITIS AND ABSCESS OF UPPER ARM AND FOREARM (ICD-682.3) Given concern of ? bone joint involvement given recent steroid injection...will treat with IM rocephin, start broader antibioticsand close follow up. Will X-ray arm and check wbc and blood cultures.   Pt does not appear toxic and no fever making septic joint less likly but this is still in deferential. Will call patient tommorow for phone check.  Will obtain and review ER notes.  Pt instructed to call if redness spreading , fever or flu like symptoms.   Her updated medication list for this problem includes:    Doxycycline Hyclate 100 Mg Caps (Doxycycline hyclate) .Marland Kitchen... Take 1 tab twice a day    Cephalexin 500 Mg Caps (Cephalexin) .Marland Kitchen... 1 by mouth 4 times daily  Orders: T-Blood Culture [phone]) Rocephin  250mg  (U9811) Admin of Therapeutic Inj  intramuscular or subcutaneous (91478) Radiology Referral (Radiology) TLB-CBC Platelet - w/Differential (85025-CBCD) Specimen Handling (29562)  Complete Medication List: 1)  Lipitor 10 Mg Tabs (Atorvastatin calcium) .... Take one a day 2)  Doxycycline Hyclate 100 Mg Caps (Doxycycline hyclate) .... Take 1 tab twice a day 3)  Cephalexin 500 Mg Caps (Cephalexin) .Marland Kitchen.. 1 by mouth 4 times daily 4)  Doxycycline Hyclate 100 Mg Caps (Doxycycline hyclate) .... Take 1 tab twice a day  Patient Instructions: 1)  Referral Appointment Information 2)  Day/Date: 3)  Time: 4)  Place/MD: 5)  Address: 6)  Phone/Fax: 7)  Patient given appointment information. Information/Orders faxed/mailed.  8)  Follow up appt on friday...work in please.  9)  Call if redness spreading, fever, not tolerating antibitoics. Prescriptions: BACTRIM DS 800-160 MG TABS (SULFAMETHOXAZOLE-TRIMETHOPRIM) 2 tab by mouth two times a day x 10 days  #20 x 0   Entered and Authorized  by:   Kerby Nora MD   Signed by:   Kerby Nora MD on 05/20/2009   Method used:   Electronically to        [facility] [address] [phone]* (retail)       [address] [place], [place]  [address]       Ph: [phone]       Fax: [phone]   RxID:   [id number]   Current Allergies (reviewed today): No known allergies    Medication Administration  Injection # 1:    Medication: Rocephin  250mg     Diagnosis: CELLULITIS AND ABSCESS OF UPPER ARM AND FOREARM (ICD-682.3)    Route: IM    Site: LUOQ gluteus    Exp Date: 04/02/2011    Lot #: [id number]    Mfr: Novartis    Comments: 1 gm Im x 1     Patient tolerated injection without complications    Given by: Benny Lennert CMA Duncan Dull) (May 20, 2009 10:59 AM)  Orders Added: 1)  Est. Patient Level IV [32951] 2)  T-Blood Culture [[id number]] 3)  Rocephin  250mg  [J0696] 4)  Admin of Therapeutic Inj  intramuscular or subcutaneous [96372] 5)  Radiology Referral [Radiology] 6)  TLB-CBC Platelet - w/Differential [85025-CBCD] 7)  Specimen Handling [99000]   Medication Administration  Injection # 1:  Medication: Rocephin  250mg     Diagnosis: CELLULITIS AND ABSCESS OF UPPER ARM AND FOREARM (ICD-682.3)    Route: IM    Site: LUOQ gluteus    Exp Date: 04/02/2011    Lot #: [id number]    Mfr: Novartis    Comments: 1 gm Im x 1     Patient tolerated injection without complications    Given by: Benny Lennert CMA Duncan Dull) (May 20, 2009 10:59 AM)  Orders Added: 1)  Est. Patient Level IV [04540] 2)  T-Blood Culture [[id number]] 3)  Rocephin  250mg  [J0696] 4)  Admin of Therapeutic Inj  intramuscular or subcutaneous [96372] 5)  Radiology Referral [Radiology] 6)  TLB-CBC Platelet - w/Differential [85025-CBCD] 7)  Specimen Handling [99000]

## 2010-09-21 ENCOUNTER — Ambulatory Visit: Payer: Self-pay | Admitting: Unknown Physician Specialty

## 2010-10-14 ENCOUNTER — Other Ambulatory Visit: Payer: Self-pay

## 2010-10-22 ENCOUNTER — Encounter: Payer: Self-pay | Admitting: Family Medicine

## 2010-10-25 ENCOUNTER — Ambulatory Visit: Payer: Self-pay | Admitting: Family Medicine

## 2010-10-26 ENCOUNTER — Ambulatory Visit (INDEPENDENT_AMBULATORY_CARE_PROVIDER_SITE_OTHER): Payer: BC Managed Care – PPO | Admitting: Family Medicine

## 2010-10-26 ENCOUNTER — Encounter: Payer: Self-pay | Admitting: Family Medicine

## 2010-10-26 DIAGNOSIS — R5383 Other fatigue: Secondary | ICD-10-CM

## 2010-10-26 DIAGNOSIS — N920 Excessive and frequent menstruation with regular cycle: Secondary | ICD-10-CM

## 2010-10-26 DIAGNOSIS — R5381 Other malaise: Secondary | ICD-10-CM

## 2010-10-26 DIAGNOSIS — E7800 Pure hypercholesterolemia, unspecified: Secondary | ICD-10-CM

## 2010-10-26 LAB — CBC WITH DIFFERENTIAL/PLATELET
Eosinophils Absolute: 0.1 10*3/uL (ref 0.0–0.7)
HCT: 39.7 % (ref 36.0–46.0)
Lymphocytes Relative: 34.6 % (ref 12.0–46.0)
Monocytes Absolute: 0.5 10*3/uL (ref 0.1–1.0)
Monocytes Relative: 5.8 % (ref 3.0–12.0)
Neutro Abs: 5.4 10*3/uL (ref 1.4–7.7)
Platelets: 239 10*3/uL (ref 150.0–400.0)
RDW: 14.5 % (ref 11.5–14.6)

## 2010-10-26 LAB — COMPREHENSIVE METABOLIC PANEL
Albumin: 4.6 g/dL (ref 3.5–5.2)
Alkaline Phosphatase: 66 U/L (ref 39–117)
BUN: 20 mg/dL (ref 6–23)
CO2: 28 mEq/L (ref 19–32)
GFR: 102.42 mL/min (ref >60.00)
Glucose, Bld: 83 mg/dL (ref 70–99)
Potassium: 4.3 mEq/L (ref 3.5–5.1)
Sodium: 141 mEq/L (ref 135–145)
Total Protein: 7.3 g/dL (ref 6.0–8.3)

## 2010-10-26 LAB — LIPID PANEL
Cholesterol: 195 mg/dL (ref 0–200)
LDL Cholesterol: 116 mg/dL — ABNORMAL HIGH (ref 0–99)
Triglycerides: 131 mg/dL (ref 0.0–149.0)

## 2010-10-26 LAB — TSH: TSH: 1.84 u[IU]/mL (ref 0.35–5.50)

## 2010-10-26 LAB — LUTEINIZING HORMONE: LH: 1.77 m[IU]/mL

## 2010-10-26 NOTE — Assessment & Plan Note (Signed)
Interested in stopping lipitor. Given past good numbers I think this is likely a possible goal. Encouraged exercise, weight loss, healthy eating habits. Will check chol today, if well controlled and stopping lipitor will still keep her under LDL of 130

## 2010-10-26 NOTE — Progress Notes (Signed)
  Subjective:    Patient ID: Jade White, female    DOB: 1965-11-26, 45 y.o.   MRN: [id number]  HPI Elevated Cholesterol:Has been on lipitor for many years.  Father with CAD age 46. Using medications without problems: Muscle aches:  Other complaints:  Continues to be fatigued over past 6 months. No CP, no SOB. No depression, no stress Menses every 3 weeks, she woderes if she is starting to go thorugh menopause.  Has appt with GYN for CPX in 12/2010.   Review of Systems  Constitutional: Negative for fever and fatigue.  HENT: Negative for ear pain.   Eyes: Negative for pain.  Respiratory: Negative for chest tightness and shortness of breath.   Cardiovascular: Negative for chest pain, palpitations and leg swelling.  Gastrointestinal: Negative for abdominal pain.  Genitourinary: Negative for dysuria.       Objective:   Physical Exam  Constitutional: Vital signs are normal. She appears well-developed and well-nourished. She is cooperative.  Non-toxic appearance. She does not appear ill. No distress.  HENT:  Head: Normocephalic.  Right Ear: Hearing, tympanic membrane, external ear and ear canal normal. Tympanic membrane is not erythematous, not retracted and not bulging.  Left Ear: Hearing, tympanic membrane, external ear and ear canal normal. Tympanic membrane is not erythematous, not retracted and not bulging.  Nose: No mucosal edema or rhinorrhea. Right sinus exhibits no maxillary sinus tenderness and no frontal sinus tenderness. Left sinus exhibits no maxillary sinus tenderness and no frontal sinus tenderness.  Mouth/Throat: Uvula is midline, oropharynx is clear and moist and mucous membranes are normal.  Eyes: Conjunctivae, EOM and lids are normal. Pupils are equal, round, and reactive to light. No foreign bodies found.  Neck: Trachea normal and normal range of motion. Neck supple. Carotid bruit is not present. No mass and no thyromegaly present.  Cardiovascular: Normal rate,  regular rhythm, S1 normal, S2 normal, normal heart sounds, intact distal pulses and normal pulses.  Exam reveals no gallop and no friction rub.   No murmur heard. Pulmonary/Chest: Effort normal and breath sounds normal. Not tachypneic. No respiratory distress. She has no decreased breath sounds. She has no wheezes. She has no rhonchi. She has no rales.  Abdominal: Soft. Normal appearance and bowel sounds are normal. There is no tenderness.  Neurological: She is alert.  Skin: Skin is warm, dry and intact. No rash noted.  Psychiatric: Her speech is normal and behavior is normal. Judgment and thought content normal. Her mood appears not anxious. Cognition and memory are normal. She does not exhibit a depressed mood.          Assessment & Plan:

## 2010-10-26 NOTE — Assessment & Plan Note (Signed)
Continued but worsened. No texercsiing. Gewtting adequate sleep. Will eval with labs.

## 2010-10-26 NOTE — Patient Instructions (Signed)
We will call with lab results.  Plan on stopping lipitor for a 3 month trial, then return for lipid recheck.

## 2010-10-27 LAB — VITAMIN D 25 HYDROXY (VIT D DEFICIENCY, FRACTURES): Vit D, 25-Hydroxy: 37 ng/mL (ref 30–89)

## 2010-11-02 ENCOUNTER — Encounter: Payer: Self-pay | Admitting: Family Medicine

## 2010-11-04 ENCOUNTER — Other Ambulatory Visit: Payer: Self-pay | Admitting: Family Medicine

## 2010-11-05 ENCOUNTER — Other Ambulatory Visit: Payer: Self-pay | Admitting: *Deleted

## 2010-11-05 MED ORDER — ATORVASTATIN CALCIUM 10 MG PO TABS: 10.0000 mg | ORAL_TABLET | Freq: Every day | ORAL | Status: DC

## 2010-11-05 NOTE — Telephone Encounter (Signed)
Patient called asking if we could change her lipitor rx to 90 with 3 refills instead of 1. Rx changed in chart, pharmacy called.

## 2011-01-18 ENCOUNTER — Ambulatory Visit (INDEPENDENT_AMBULATORY_CARE_PROVIDER_SITE_OTHER): Payer: BC Managed Care – PPO | Admitting: *Deleted

## 2011-01-18 DIAGNOSIS — Z23 Encounter for immunization: Secondary | ICD-10-CM

## 2011-01-18 MED ORDER — TUBERCULIN PPD 5 UNIT/0.1ML ID SOLN: 5.0000 [IU] | Freq: Once | INTRADERMAL | Status: DC

## 2011-01-18 NOTE — Progress Notes (Signed)
TB skin test placed today during nurse visit.

## 2011-01-20 ENCOUNTER — Ambulatory Visit (INDEPENDENT_AMBULATORY_CARE_PROVIDER_SITE_OTHER): Payer: BC Managed Care – PPO | Admitting: *Deleted

## 2011-01-20 DIAGNOSIS — Z111 Encounter for screening for respiratory tuberculosis: Secondary | ICD-10-CM

## 2011-01-20 LAB — READ PPD: TB Skin Test: NEGATIVE mm

## 2011-06-28 ENCOUNTER — Ambulatory Visit (INDEPENDENT_AMBULATORY_CARE_PROVIDER_SITE_OTHER): Payer: BC Managed Care – PPO | Admitting: Surgery

## 2011-07-12 ENCOUNTER — Ambulatory Visit (INDEPENDENT_AMBULATORY_CARE_PROVIDER_SITE_OTHER): Payer: BC Managed Care – PPO | Admitting: Surgery

## 2011-10-19 ENCOUNTER — Other Ambulatory Visit: Payer: Self-pay | Admitting: Family Medicine

## 2011-10-25 ENCOUNTER — Ambulatory Visit: Payer: Self-pay | Admitting: Obstetrics and Gynecology

## 2011-10-28 ENCOUNTER — Ambulatory Visit (INDEPENDENT_AMBULATORY_CARE_PROVIDER_SITE_OTHER): Payer: BC Managed Care – PPO | Admitting: Cardiovascular Disease

## 2011-10-28 ENCOUNTER — Encounter: Payer: Self-pay | Admitting: Cardiovascular Disease

## 2011-10-28 VITALS — BP 120/70 | HR 81 | Ht 68.0 in | Wt 194.0 lb

## 2011-10-28 DIAGNOSIS — R079 Chest pain, unspecified: Secondary | ICD-10-CM

## 2011-10-28 DIAGNOSIS — E7800 Pure hypercholesterolemia, unspecified: Secondary | ICD-10-CM

## 2011-10-28 DIAGNOSIS — R0789 Other chest pain: Secondary | ICD-10-CM

## 2011-10-28 DIAGNOSIS — I059 Rheumatic mitral valve disease, unspecified: Secondary | ICD-10-CM

## 2011-10-28 NOTE — Progress Notes (Signed)
   Patient ID: Jade White, female    DOB: 1966/04/15, 46 y.o.   MRN: [id number]  HPI Comments: [name] is a 46 year old woman with history of chest pain, cardiac testing dating back to 2000 with stress test in 2005, 2008, echocardiogram in 2008. Echocardiogram in 2008 suggestive mitral bowel prolapse with trace mitral valve regurgitation otherwise normal study. She presents to establish care with symptoms of chest discomfort.  She reports having symptoms of heaviness in her chest similar to previous symptoms. She does not describe it as a pain, more of a heaviness. She has had significant weight gain over the past several years. She is concerned given the strong family history of coronary artery disease. Father had bypass surgery and is still alive in his 47s. She does not smoke, no diabetes. She is active and has 2 children. Chest pain does not seem to present with exertion and comes on sometimes at rest, sometimes with activity. No diaphoresis, lightheadedness.  EKG shows normal sinus rhythm with rate 81 beats per minute with nonspecific T wave abnormality   Outpatient Encounter Prescriptions as of 10/28/2011  Medication Sig Dispense Refill  . atorvastatin (LIPITOR) 10 MG tablet TAKE 1 TABLET (10 MG TOTAL) BY MOUTH DAILY.  90 tablet  0  . DISCONTD: albuterol (PROAIR HFA) 108 (90 BASE) MCG/ACT inhaler Inhale 2 puffs into the lungs every 4 (four) hours as needed. For cough         Review of Systems  Constitutional: Negative.   HENT: Negative.   Eyes: Negative.   Respiratory: Negative.   Cardiovascular: Positive for chest pain.  Gastrointestinal: Negative.   Musculoskeletal: Negative.   Skin: Negative.   Neurological: Negative.   Hematological: Negative.   Psychiatric/Behavioral: Negative.   All other systems reviewed and are negative.   BP 120/70  Pulse 81  Ht 5\' 8"  (1.727 m)  Wt 194 lb (87.998 kg)  BMI 29.50 kg/m2  Physical Exam  Nursing note and vitals  reviewed. Constitutional: She is oriented to person, place, and time. She appears well-developed and well-nourished.  HENT:  Head: Normocephalic.  Nose: Nose normal.  Mouth/Throat: Oropharynx is clear and moist.  Eyes: Conjunctivae are normal. Pupils are equal, round, and reactive to light.  Neck: Normal range of motion. Neck supple. No JVD present.  Cardiovascular: Normal rate, regular rhythm, S1 normal, S2 normal, normal heart sounds and intact distal pulses.  Exam reveals no gallop and no friction rub.   No murmur heard. Pulmonary/Chest: Effort normal and breath sounds normal. No respiratory distress. She has no wheezes. She has no rales. She exhibits no tenderness.  Abdominal: Soft. Bowel sounds are normal. She exhibits no distension. There is no tenderness.  Musculoskeletal: Normal range of motion. She exhibits no edema and no tenderness.  Lymphadenopathy:    She has no cervical adenopathy.  Neurological: She is alert and oriented to person, place, and time. Coordination normal.  Skin: Skin is warm and dry. No rash noted. No erythema.  Psychiatric: She has a normal mood and affect. Her behavior is normal. Judgment and thought content normal.         Assessment and Plan

## 2011-10-28 NOTE — Patient Instructions (Addendum)
You are doing well. No medication changes were made.  We will schedule a treadmill study for chest symptoms  Please call us if you have new issues that need to be addressed before your next appt.  Your physician wants you to follow-up in: 12 months.  You will receive a reminder letter in the mail two months in advance. If you don't receive a letter, please call our office to schedule the follow-up appointment.

## 2011-10-28 NOTE — Assessment & Plan Note (Signed)
Long history of chest discomfort with stress testing in 2005, 2008. Likely noncardiac but we will schedule a routine treadmill study. She would not meet criteria for nuclear Myoview.

## 2011-10-28 NOTE — Assessment & Plan Note (Signed)
Minimal mitral valve regurgitation seen on echocardiogram in 2008. No murmur on exam. No indication for repeat echocardiogram at this time unless she has worsening symptoms.

## 2011-10-28 NOTE — Assessment & Plan Note (Signed)
Cholesterol has recently increased with her weight gain. We have suggested she work on her way to the summer, continue on Lipitor 10 mg daily with a repeat cholesterol check this fall. If it continues to be mildly elevated, she could consider increasing Lipitor to 20 mg daily.

## 2011-11-11 ENCOUNTER — Ambulatory Visit (INDEPENDENT_AMBULATORY_CARE_PROVIDER_SITE_OTHER): Payer: BC Managed Care – PPO | Admitting: Cardiovascular Disease

## 2011-11-11 VITALS — BP 122/82 | Ht 68.0 in | Wt 193.0 lb

## 2011-11-11 DIAGNOSIS — R079 Chest pain, unspecified: Secondary | ICD-10-CM

## 2011-11-11 NOTE — Procedures (Signed)
Exercise Treadmill Test  Treadmill ordered for recent epsiodes of chest pain.  Resting EKG shows NSR with rate of 111 bpm, no significant ST or T wave changes Resting blood pressure of 122/82. Stand bruce protocal was used.  Patient exercised for 10 min Peak heart rate of 160 bpm.  This was 100% of the maximum predicted heart rate (target heart rate 147). Achieved 12.9 METS No symptoms of chest pain or lightheadedness were reported at peak stress or in recovery.  Peak Blood pressure recorded was 179/68. Heart rate at 3 minutes in recovery was 115 bpm.  FINAL IMPRESSION: Normal exercise stress test. No significant EKG changes concerning for ischemia. Excellent exercise tolerance.

## 2011-11-11 NOTE — Patient Instructions (Addendum)
Normal stress test Continue regular exercise No further testing

## 2011-11-29 ENCOUNTER — Encounter: Payer: Self-pay | Admitting: Family Medicine

## 2011-11-29 ENCOUNTER — Ambulatory Visit (INDEPENDENT_AMBULATORY_CARE_PROVIDER_SITE_OTHER): Payer: BC Managed Care – PPO | Admitting: Family Medicine

## 2011-11-29 VITALS — BP 120/72 | HR 88 | Temp 98.3°F | Ht 68.0 in | Wt 193.5 lb

## 2011-11-29 DIAGNOSIS — E7800 Pure hypercholesterolemia, unspecified: Secondary | ICD-10-CM

## 2011-11-29 LAB — COMPREHENSIVE METABOLIC PANEL
ALT: 12 U/L (ref 0–35)
AST: 17 U/L (ref 0–37)
Albumin: 4.1 g/dL (ref 3.5–5.2)
CO2: 28 mEq/L (ref 19–32)
Calcium: 9.4 mg/dL (ref 8.4–10.5)
Chloride: 103 mEq/L (ref 96–112)
GFR: 101.93 mL/min (ref >60.00)
Potassium: 4.4 mEq/L (ref 3.5–5.1)
Sodium: 139 mEq/L (ref 135–145)
Total Protein: 7 g/dL (ref 6.0–8.3)

## 2011-11-29 LAB — LIPID PANEL
HDL: 48.6 mg/dL (ref >39.00)
Total CHOL/HDL Ratio: 4

## 2011-11-29 MED ORDER — ATORVASTATIN CALCIUM 10 MG PO TABS: 10.0000 mg | ORAL_TABLET | Freq: Every day | ORAL | Status: DC

## 2011-11-29 MED ORDER — ATORVASTATIN CALCIUM 20 MG PO TABS: 20.0000 mg | ORAL_TABLET | Freq: Every day | ORAL | Status: DC

## 2011-11-29 NOTE — Patient Instructions (Addendum)
Work on exercise, weight loss and diet changes. We will call with lab results.

## 2011-11-29 NOTE — Progress Notes (Signed)
  Subjective:    Patient ID: Jade White, female    DOB: 02-Sep-1965, 46 y.o.   MRN: [id number]  HPI  Yearly visit to follow chronic issues.  Has GYN exam yearly.Marland Kitchen Upcoming.   Elevated Cholesterol:Due for re-eval. On lipitor 10mg  daily.. cards recommended 20 mg daily. Lab Results  Component Value Date   CHOL 195 10/26/2010   HDL 53.00 10/26/2010   LDLCALC 116* 10/26/2010   TRIG 131.0 10/26/2010   CHOLHDL 4 10/26/2010  Using medications without problems: Muscle aches: None Diet compliance: Good Exercise: none Other complaints:  MVP, chest haviness: saw Cardiology, Dr Mariah Milling. Had treadmill stress test was normal. Minimal mitral valve regurgitation seen on echocardiogram in 2008. No murmur on exam. No indication for repeat echocardiogram at this time unless she has worsening symptoms.      Review of Systems  Constitutional: Negative for fever and fatigue.  HENT: Negative for ear pain.   Eyes: Negative for pain.  Respiratory: Negative for chest tightness and shortness of breath.   Cardiovascular: Negative for chest pain, palpitations and leg swelling.  Gastrointestinal: Negative for abdominal pain.  Genitourinary: Negative for dysuria.       Objective:   Physical Exam  Constitutional: Vital signs are normal. She appears well-developed and well-nourished. She is cooperative.  Non-toxic appearance. She does not appear ill. No distress.  HENT:  Head: Normocephalic.  Right Ear: Hearing, tympanic membrane, external ear and ear canal normal. Tympanic membrane is not erythematous, not retracted and not bulging.  Left Ear: Hearing, tympanic membrane, external ear and ear canal normal. Tympanic membrane is not erythematous, not retracted and not bulging.  Nose: No mucosal edema or rhinorrhea. Right sinus exhibits no maxillary sinus tenderness and no frontal sinus tenderness. Left sinus exhibits no maxillary sinus tenderness and no frontal sinus tenderness.  Mouth/Throat: Uvula is  midline, oropharynx is clear and moist and mucous membranes are normal.  Eyes: Conjunctivae, EOM and lids are normal. Pupils are equal, round, and reactive to light. No foreign bodies found.  Neck: Trachea normal and normal range of motion. Neck supple. Carotid bruit is not present. No mass and no thyromegaly present.  Cardiovascular: Normal rate, regular rhythm, S1 normal, S2 normal, normal heart sounds, intact distal pulses and normal pulses.  Exam reveals no gallop and no friction rub.   No murmur heard. Pulmonary/Chest: Effort normal and breath sounds normal. Not tachypneic. No respiratory distress. She has no decreased breath sounds. She has no wheezes. She has no rhonchi. She has no rales.  Abdominal: Soft. Normal appearance and bowel sounds are normal. There is no tenderness.  Neurological: She is alert.  Skin: Skin is warm, dry and intact. No rash noted.  Psychiatric: Her speech is normal and behavior is normal. Judgment and thought content normal. Her mood appears not anxious. Cognition and memory are normal. She does not exhibit a depressed mood.          Assessment & Plan:  The patient's preventative maintenance and recommended screening tests for an annual wellness exam were reviewed in full today. Brought up to date unless services declined.  Counselled on the importance of diet, exercise, and its role in overall health and mortality. The patient's FH and SH was reviewed, including their home life, tobacco status, and drug and alcohol status.   Vaccines: Td.. Thought was done 7 years ago  Mammo:08/2011 nml DVE/pap: Per GYN

## 2011-11-29 NOTE — Assessment & Plan Note (Signed)
Will re-eval... If cholesterol still elevated.. She is interested in increasing lipitor to 20 mg daily.

## 2011-12-01 ENCOUNTER — Encounter: Payer: Self-pay | Admitting: *Deleted

## 2011-12-26 ENCOUNTER — Other Ambulatory Visit: Payer: Self-pay | Admitting: *Deleted

## 2011-12-26 MED ORDER — ATORVASTATIN CALCIUM 20 MG PO TABS: 20.0000 mg | ORAL_TABLET | Freq: Every day | ORAL | Status: DC

## 2011-12-26 NOTE — Telephone Encounter (Signed)
Patient called requesting that a 90 day supply with 3 refills of her Lipitor be sent to her new mail order pharmacy [facility]. Rx sent to pharmacy as requested.

## 2012-02-08 ENCOUNTER — Ambulatory Visit (INDEPENDENT_AMBULATORY_CARE_PROVIDER_SITE_OTHER): Payer: Managed Care, Other (non HMO) | Admitting: General Surgery

## 2012-02-08 ENCOUNTER — Encounter (INDEPENDENT_AMBULATORY_CARE_PROVIDER_SITE_OTHER): Payer: Self-pay | Admitting: General Surgery

## 2012-02-08 VITALS — BP 122/80 | HR 83 | Temp 98.4°F | Ht 68.0 in | Wt 183.2 lb

## 2012-02-08 DIAGNOSIS — R109 Unspecified abdominal pain: Secondary | ICD-10-CM

## 2012-02-08 NOTE — Progress Notes (Signed)
Patient ID: Jade White, female   DOB: 1965-11-10, 46 y.o.   MRN: [id number]  Chief Complaint  Patient presents with  . Pre-op Exam    eval umb hernia    HPI Jade White is a 46 y.o. female.  Here for evaluation of periumbilical pain which started 2-3 years ago.  She was seen here in 2011 by Dr Michaell Cowing and she was offered dx laparoscopy at that time but her symptoms were off and on at that time and she did not have repair.  She says that over the last 2-3 weeks since she began exercising, she has had more severe "shooting" pain which is localized to the periumbilical region and physically limits her ability to do situps and other activities. She says that she notices a bulge in the area but denies any other obstructive symptoms. She also has a history of fibroids and is scheduled for pelvic ultrasound and possible surgery for hysterectomy for treatment of this as well. HPI  Past Medical History  Diagnosis Date  . Mitral valve disorders   . Other urinary incontinence   . Pure hypercholesterolemia   . History of chicken pox     Past Surgical History  Procedure Date  . C-section     2 times  . Induced abortion     2 times  . Cesarean section 11/12/96 & 08/20/01    Family History  Problem Relation Age of Onset  . Hypertension Mother   . Coronary artery disease Father   . Melanoma Maternal Grandfather   . Cancer Maternal Grandmother     colon    Social History History  Substance Use Topics  . Smoking status: Never Smoker   . Smokeless tobacco: Not on file  . Alcohol Use: No    No Known Allergies  Current Outpatient Prescriptions  Medication Sig Dispense Refill  . atorvastatin (LIPITOR) 20 MG tablet Take 1 tablet (20 mg total) by mouth daily.  90 tablet  3    Review of Systems Review of Systems All other review of systems negative or noncontributory except as stated in the HPI  Blood pressure 122/80, pulse 83, temperature 98.4 F (36.9 C), temperature source  Temporal, height 5\' 8"  (1.727 m), weight 183 lb 3.2 oz (83.099 kg), SpO2 98.00%.  Physical Exam Physical Exam Physical Exam  Nursing note and vitals reviewed. Constitutional: She is oriented to person, place, and time. She appears well-developed and well-nourished. No distress.  HENT:  Head: Normocephalic and atraumatic.  Mouth/Throat: No oropharyngeal exudate.  Eyes: Conjunctivae and EOM are normal. Pupils are equal, round, and reactive to light. Right eye exhibits no discharge. Left eye exhibits no discharge. No scleral icterus.  Neck: Normal range of motion. Neck supple. No tracheal deviation present.  Cardiovascular: Normal rate, regular rhythm, normal heart sounds and intact distal pulses.   Pulmonary/Chest: Effort normal and breath sounds normal. No stridor. No respiratory distress. She has no wheezes.  Abdominal: Soft. Bowel sounds are normal. She exhibits no distension and no mass. There is no tenderness. There is no rebound and no guarding. I do not appreciate any obvious hernia on exam but the exam is limited somewhat by tenderness. Musculoskeletal: Normal range of motion. She exhibits no edema and no tenderness.  Neurological: She is alert and oriented to person, place, and time.  Skin: Skin is warm and dry. No rash noted. She is not diaphoretic. No erythema. No pallor.  Psychiatric: She has a normal mood and affect. Her behavior is normal.  Judgment and thought content normal.    Data Reviewed   Assessment    Periumbilical pain and likely umbilical hernia I have recommended CT scan of the abdomen to further evaluate for possible umbilical defect. Further history it sounds as though this is most likely due to an umbilical hernia and I would like to confirm this with CT scan prior to setting up any definitive surgical intervention. We discussed the possibility of combining this hernia repaired with a hysterectomy if this is scheduled by her gynecologist and if this is a small  defect, and we would consider primary repair at the same time but would unlikely that mesh at that time. We discussed the pros and cons of mesh versus primary  And the recurrence rate with each and and we will go ahead and set her up for CT scan and she will call back after her CT to discuss results and to possibly schedule her procedure.    Plan    CT abdomen and pelvis and she will followup with me after her CT to discuss the results and possibly schedule surgery       Raeshawn Vo DAVID 02/08/2012, 10:33 AM

## 2012-02-10 ENCOUNTER — Other Ambulatory Visit (INDEPENDENT_AMBULATORY_CARE_PROVIDER_SITE_OTHER): Payer: Self-pay

## 2012-02-10 ENCOUNTER — Telehealth (INDEPENDENT_AMBULATORY_CARE_PROVIDER_SITE_OTHER): Payer: Self-pay | Admitting: General Surgery

## 2012-02-10 ENCOUNTER — Ambulatory Visit
Admission: RE | Admit: 2012-02-10 | Discharge: 2012-02-10 | Disposition: A | Discharge status: Home / Self Care | Payer: Managed Care, Other (non HMO) | Source: Ambulatory Visit | Attending: General Surgery | Admitting: General Surgery

## 2012-02-10 ENCOUNTER — Telehealth (INDEPENDENT_AMBULATORY_CARE_PROVIDER_SITE_OTHER): Payer: Self-pay

## 2012-02-10 DIAGNOSIS — K635 Polyp of colon: Secondary | ICD-10-CM

## 2012-02-10 DIAGNOSIS — R109 Unspecified abdominal pain: Secondary | ICD-10-CM

## 2012-02-10 MED ORDER — IOHEXOL 300 MG/ML  SOLN
100.0000 mL | Freq: Once | INTRAMUSCULAR | Status: AC | PRN
  Administered 2012-02-10: 100 mL via INTRAVENOUS

## 2012-02-10 NOTE — Telephone Encounter (Signed)
CT results discussed with patient.  Patient referred to [facility] for further evaluation of Cecum Polyp.  Appointment 02/20/12 @ 2:00 pm w/Dr. Bosie Clos.  Notes to be faxed to [phone].

## 2012-02-10 NOTE — Telephone Encounter (Signed)
I discussed the CT results with her.  NO hernia found. Fibroids and stable liver and renal masses.  I also recommended a colonoscopy for eval of possible cecal polyp.  I have already spoken with my nurse to refer her for cscope.  If she has not heard about an appt. She will call us back. We also discussed the possiblity of dx laparoscopy for eval of periumbilical pain and possible hernia.  She will think about this and call me back.

## 2012-02-15 ENCOUNTER — Telehealth (INDEPENDENT_AMBULATORY_CARE_PROVIDER_SITE_OTHER): Payer: Self-pay

## 2012-02-15 NOTE — Telephone Encounter (Signed)
Jade White from [name] called to let Biagio Quint know that patient no showed her appt with GI.

## 2012-03-15 ENCOUNTER — Ambulatory Visit: Payer: Self-pay | Admitting: Obstetrics and Gynecology

## 2012-03-15 LAB — CBC
HCT: 42.2 % (ref 35.0–47.0)
HGB: 13.9 g/dL (ref 12.0–16.0)
MCH: 28.2 pg (ref 26.0–34.0)
MCHC: 32.9 g/dL (ref 32.0–36.0)
MCV: 86 fL (ref 80–100)
RBC: 4.92 10*6/uL (ref 3.80–5.20)
RDW: 15.5 % — ABNORMAL HIGH (ref 11.5–14.5)

## 2012-03-15 LAB — BASIC METABOLIC PANEL
BUN: 18 mg/dL (ref 7–18)
Chloride: 109 mmol/L — ABNORMAL HIGH (ref 98–107)
Creatinine: 0.55 mg/dL — ABNORMAL LOW (ref 0.60–1.30)
EGFR (Non-African Amer.): >60
Glucose: 83 mg/dL (ref 65–99)
Osmolality: 279 (ref 275–301)
Potassium: 4.6 mmol/L (ref 3.5–5.1)
Sodium: 139 mmol/L (ref 136–145)

## 2012-03-22 ENCOUNTER — Ambulatory Visit: Payer: Self-pay | Admitting: Obstetrics and Gynecology

## 2012-03-22 HISTORY — PX: LAPAROSCOPIC HYSTERECTOMY: SHX1926

## 2012-03-23 LAB — BASIC METABOLIC PANEL
BUN: 7 mg/dL (ref 7–18)
Chloride: 108 mmol/L — ABNORMAL HIGH (ref 98–107)
Co2: 25 mmol/L (ref 21–32)
EGFR (Non-African Amer.): >60
Glucose: 131 mg/dL — ABNORMAL HIGH (ref 65–99)
Osmolality: 277 (ref 275–301)
Potassium: 3.8 mmol/L (ref 3.5–5.1)

## 2012-03-23 LAB — HEMATOCRIT: HCT: 37.2 % (ref 35.0–47.0)

## 2012-04-13 ENCOUNTER — Telehealth: Payer: Self-pay

## 2012-04-13 ENCOUNTER — Other Ambulatory Visit: Payer: Self-pay | Admitting: *Deleted

## 2012-04-13 ENCOUNTER — Ambulatory Visit (INDEPENDENT_AMBULATORY_CARE_PROVIDER_SITE_OTHER): Payer: Managed Care, Other (non HMO) | Admitting: Family Medicine

## 2012-04-13 ENCOUNTER — Encounter: Payer: Self-pay | Admitting: Family Medicine

## 2012-04-13 VITALS — BP 120/74 | HR 68 | Temp 98.2°F | Ht 68.0 in | Wt 175.5 lb

## 2012-04-13 DIAGNOSIS — R5381 Other malaise: Secondary | ICD-10-CM

## 2012-04-13 DIAGNOSIS — R5383 Other fatigue: Secondary | ICD-10-CM

## 2012-04-13 DIAGNOSIS — N809 Endometriosis, unspecified: Secondary | ICD-10-CM

## 2012-04-13 DIAGNOSIS — E7800 Pure hypercholesterolemia, unspecified: Secondary | ICD-10-CM

## 2012-04-13 LAB — COMPREHENSIVE METABOLIC PANEL
ALT: 14 U/L (ref 0–35)
Albumin: 4.3 g/dL (ref 3.5–5.2)
CO2: 27 mEq/L (ref 19–32)
Chloride: 102 mEq/L (ref 96–112)
GFR: 98.32 mL/min (ref >60.00)
Glucose, Bld: 84 mg/dL (ref 70–99)
Potassium: 3.8 mEq/L (ref 3.5–5.1)
Sodium: 135 mEq/L (ref 135–145)
Total Bilirubin: 1 mg/dL (ref 0.3–1.2)
Total Protein: 7.8 g/dL (ref 6.0–8.3)

## 2012-04-13 LAB — CBC WITH DIFFERENTIAL/PLATELET
Basophils Relative: 0.3 % (ref 0.0–3.0)
Eosinophils Relative: 3.8 % (ref 0.0–5.0)
Hemoglobin: 12.6 g/dL (ref 12.0–15.0)
Lymphocytes Relative: 36.5 % (ref 12.0–46.0)
MCV: 84.7 fl (ref 78.0–100.0)
Neutro Abs: 5 10*3/uL (ref 1.4–7.7)
Neutrophils Relative %: 53.9 % (ref 43.0–77.0)
RBC: 4.54 Mil/uL (ref 3.87–5.11)
WBC: 9.2 10*3/uL (ref 4.5–10.5)

## 2012-04-13 LAB — LIPID PANEL: VLDL: 25 mg/dL (ref 0.0–40.0)

## 2012-04-13 NOTE — Telephone Encounter (Signed)
I don't believe she as to wait very long. She may want to run by that same question with her GYN who did the surgery. But I believe she can go ahead. Let me know if I need to make referral.

## 2012-04-13 NOTE — Patient Instructions (Addendum)
We will call with lab results or you can check MyChart. Follow up in 6 months for CPX with fasting labs prior.

## 2012-04-13 NOTE — Assessment & Plan Note (Signed)
Due for re-eval on higher dose of lipitor. Encouraged exercise, weight loss, healthy eating habits.

## 2012-04-13 NOTE — Telephone Encounter (Signed)
Left message for patient to return my call.

## 2012-04-13 NOTE — Assessment & Plan Note (Signed)
Total visit time 30 minutes, > 50% spent counseling and cordinating patients care. Answered pt questions and discussed specialist recommendations.

## 2012-04-13 NOTE — Progress Notes (Signed)
  Subjective:    Patient ID: Jade White, female    DOB: 06-11-1965, 46 y.o.   MRN: [id number]  HPI 46 year old female presents for follow up cholesterol as well as questions about hysterectomy.  She had hysterectomy, ovaries remain 03/22/2012 for fibroids.  Has had intermittent pain in right upper quadrant in past. 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  Referred to [facility] for specialist. Told plan was to follow given near to menopause. Plan OCPs.  Has CT of chest following    Elevated Cholesterol: Due for lab reeval. On lipitor 20 mg for last 6 months, up form 10 mg daily.  Has lost 20 lbs. Wt Readings from Last 3 Encounters:  04/13/12 175 lb 8 oz (79.606 kg)  02/08/12 183 lb 3.2 oz (83.099 kg)  11/29/11 193 lb 8 oz (87.771 kg)   Using medications without problems: Muscle aches:  Diet compliance: Good Exercise: Regular until surgery. Other complaints:     Review of Systems  Constitutional: Negative for fever and fatigue.  HENT: Negative for ear pain.   Eyes: Negative for pain.  Respiratory: Negative for chest tightness and shortness of breath.   Cardiovascular: Negative for chest pain, palpitations and leg swelling.  Gastrointestinal: Positive for abdominal pain.  Genitourinary: Negative for dysuria.       Objective:   Physical Exam  Constitutional: Vital signs are normal. She appears well-developed and well-nourished. She is cooperative.  Non-toxic appearance. She does not appear ill. No distress.  HENT:  Head: Normocephalic.  Right Ear: Hearing, tympanic membrane, external ear and ear canal normal. Tympanic membrane is not erythematous, not retracted and not bulging.  Left Ear: Hearing, tympanic membrane, external ear and ear canal normal. Tympanic membrane is not erythematous, not retracted and not bulging.  Nose: No mucosal edema or rhinorrhea. Right sinus exhibits no maxillary sinus tenderness and no frontal sinus  tenderness. Left sinus exhibits no maxillary sinus tenderness and no frontal sinus tenderness.  Mouth/Throat: Uvula is midline, oropharynx is clear and moist and mucous membranes are normal.  Eyes: Conjunctivae normal, EOM and lids are normal. Pupils are equal, round, and reactive to light. No foreign bodies found.  Neck: Trachea normal and normal range of motion. Neck supple. Carotid bruit is not present. No mass and no thyromegaly present.  Cardiovascular: Normal rate, regular rhythm, S1 normal, S2 normal, normal heart sounds, intact distal pulses and normal pulses.  Exam reveals no gallop and no friction rub.   No murmur heard. Pulmonary/Chest: Effort normal and breath sounds normal. Not tachypneic. No respiratory distress. She has no decreased breath sounds. She has no wheezes. She has no rhonchi. She has no rales.  Abdominal: Soft. Normal appearance and bowel sounds are normal. There is no tenderness.  Neurological: She is alert.  Skin: Skin is warm, dry and intact. No rash noted.  Psychiatric: Her speech is normal and behavior is normal. Judgment and thought content normal. Her mood appears not anxious. Cognition and memory are normal. She does not exhibit a depressed mood.          Assessment & Plan:

## 2012-04-13 NOTE — Telephone Encounter (Signed)
Pt left v/m on 03/22/12 pt had laproscopic hysterectomy; prior to hysterectomy had CT scan done showed polyp in colon and pt was to f/u with colonoscopy. Pt wants to know how long has to wait after hysterectomy to get colonoscopy scheduled.Please advise.

## 2012-04-16 NOTE — Telephone Encounter (Signed)
Patient advised.

## 2012-04-18 ENCOUNTER — Ambulatory Visit
Admission: RE | Admit: 2012-04-18 | Discharge: 2012-04-18 | Disposition: A | Discharge status: Home / Self Care | Payer: Managed Care, Other (non HMO) | Source: Ambulatory Visit | Attending: *Deleted | Admitting: *Deleted

## 2012-04-18 DIAGNOSIS — N809 Endometriosis, unspecified: Secondary | ICD-10-CM

## 2012-04-18 MED ORDER — IOHEXOL 300 MG/ML  SOLN
75.0000 mL | Freq: Once | INTRAMUSCULAR | Status: AC | PRN
  Administered 2012-04-18: 75 mL via INTRAVENOUS

## 2012-05-07 ENCOUNTER — Telehealth: Payer: Self-pay | Admitting: Family Medicine

## 2012-05-07 DIAGNOSIS — Z86010 Personal history of colonic polyps: Secondary | ICD-10-CM

## 2012-05-07 NOTE — Telephone Encounter (Signed)
Patient wants you to refer her for a Colonoscopy Please see the CT report in Epic that [facility] ordered that saw a possible polyp. She couldn't have this done b/c she had the hysterectomy done first in November. [facility] started this referral but she prefers that we do the referral for her and wants to get this done in February if possible and on a Monday or Friday for the Colon as her husband works in [place].. Please call her back at [phone].

## 2012-05-08 ENCOUNTER — Encounter: Payer: Self-pay | Admitting: Gastroenterology

## 2012-06-04 ENCOUNTER — Other Ambulatory Visit: Payer: Managed Care, Other (non HMO) | Admitting: Gastroenterology

## 2012-06-11 ENCOUNTER — Ambulatory Visit (AMBULATORY_SURGERY_CENTER): Payer: Managed Care, Other (non HMO) | Admitting: *Deleted

## 2012-06-11 ENCOUNTER — Encounter: Payer: Self-pay | Admitting: Gastroenterology

## 2012-06-11 VITALS — Ht 68.0 in | Wt 187.8 lb

## 2012-06-11 DIAGNOSIS — Z1211 Encounter for screening for malignant neoplasm of colon: Secondary | ICD-10-CM

## 2012-06-11 DIAGNOSIS — R933 Abnormal findings on diagnostic imaging of other parts of digestive tract: Secondary | ICD-10-CM

## 2012-06-11 MED ORDER — MOVIPREP 100 G PO SOLR: ORAL | Status: AC

## 2012-06-16 ENCOUNTER — Other Ambulatory Visit: Payer: Self-pay

## 2012-06-24 ENCOUNTER — Telehealth: Payer: Self-pay | Admitting: Gastroenterology

## 2012-06-24 ENCOUNTER — Telehealth: Payer: Self-pay | Admitting: Internal Medicine

## 2012-06-24 MED ORDER — ONDANSETRON HCL 4 MG PO TABS: 4.0000 mg | ORAL_TABLET | Freq: Three times a day (TID) | ORAL | Status: DC | PRN

## 2012-06-24 MED ORDER — ONDANSETRON HCL 4 MG PO TABS: 4.0000 mg | ORAL_TABLET | Freq: Three times a day (TID) | ORAL | Status: AC | PRN

## 2012-06-24 NOTE — Telephone Encounter (Signed)
Colon tomorrow.  Tried Moviprep tonight with zofran called in earlier today by Dr. Christella Hartigan Got through 3 parts of the 1st half of the prep and just vomited.  No BM yet. Remains very nauseated despite zofran.   Does not think she can proceed despite her best efforts.  She regrets, but feels she must cancel colon for tomorrow. Will reschedule.  Plan for next colon date will be Miralax + gatorade prep with reglan 10 mg (given 1 hour before each 1/2 of prep).  Office to arrange next colon appt date.  No charge for this cancellation given medication reaction beyond her control

## 2012-06-24 NOTE — Telephone Encounter (Signed)
ihjhg

## 2012-06-24 NOTE — Telephone Encounter (Signed)
She is pretty certain she is going to be nauseas during her colonoscopy prep.  Has vomited with a prep previously.  zofran called in.  She will take 1 pill 1 hour before each dose of moviprep

## 2012-06-25 ENCOUNTER — Encounter: Payer: Managed Care, Other (non HMO) | Admitting: Gastroenterology

## 2012-06-25 ENCOUNTER — Telehealth: Payer: Self-pay | Admitting: *Deleted

## 2012-06-25 NOTE — Telephone Encounter (Signed)
Do suprep...not volume prep

## 2012-06-25 NOTE — Telephone Encounter (Signed)
     Colon tomorrow. Tried Moviprep tonight with zofran called in earlier today by Dr. Christella Hartigan  Got through 3 parts of the 1st half of the prep and just vomited. No BM yet.  Remains very nauseated despite zofran.  Does not think she can proceed despite her best efforts. She regrets, but feels she must cancel colon for tomorrow.  Will reschedule.  Plan for next colon date will be Miralax + gatorade prep with reglan 10 mg (given 1 hour before each 1/2 of prep).  Office to arrange next colon appt date.  No charge for this cancellation given medication reaction beyond her control  Pt reports over 2 hours time, she vomited all the prep; states she hasn't had a BM yet. She will have to call back to r/s d/t her husband works out of [place].

## 2012-10-25 ENCOUNTER — Ambulatory Visit: Payer: Self-pay | Admitting: Obstetrics and Gynecology

## 2012-11-13 ENCOUNTER — Other Ambulatory Visit: Payer: Self-pay

## 2012-11-13 MED ORDER — ATORVASTATIN CALCIUM 20 MG PO TABS: 20.0000 mg | ORAL_TABLET | Freq: Every day | ORAL | Status: AC

## 2012-11-13 NOTE — Telephone Encounter (Signed)
Pt has just moved to [place] but does plan to continue to have Dr Ermalene Searing as her PCP. Pt request refill of atorvastatin to [facility]. Advised pt she cancelled her CPX in 11/2012 due to moving to [place]; pt said that was because how busy she was not because changing doctors. Pt said since she was seen 04/2012 can pt come for lab only or does pt have to make appt with Dr Ermalene Searing.Please advise. Pt request cb.

## 2012-11-13 NOTE — Telephone Encounter (Signed)
Medication refilled and patient advised to reschedule her cpx appt

## 2012-11-13 NOTE — Telephone Encounter (Signed)
Labs were at goal at that time.. She can recheck them in 12.2014.  She does need to reschedule CPX though.

## 2012-11-30 ENCOUNTER — Encounter: Payer: Managed Care, Other (non HMO) | Admitting: Family Medicine

## 2012-12-13 ENCOUNTER — Encounter: Payer: Managed Care, Other (non HMO) | Admitting: Family Medicine

## 2013-03-07 ENCOUNTER — Other Ambulatory Visit: Payer: Self-pay

## 2013-07-08 IMAGING — CT CT ABD-PELV W/ CM
3 of 5 series · 13 of 36 positions shown, 19 images · IV contrast (READICAT & [ID] OMNI 300)
Comparison: 01/28/2010.

CLINICAL DATA: Periumbilical pain.  Question hernia.

CT ABDOMEN AND PELVIS WITH CONTRAST
TECHNIQUE: Multidetector CT imaging of the abdomen and pelvis was
performed following the standard protocol during bolus
administration of intravenous contrast.
Contrast: 100mL OMNIPAQUE IOHEXOL 300 MG/ML  SOLN

[Series 3: abd/pelvis with · axial · 0.75mm/px · z∈[-374,-29]mm · 8 of 89 slices shown, 13 images]
[im 10/89  soft-tissue]
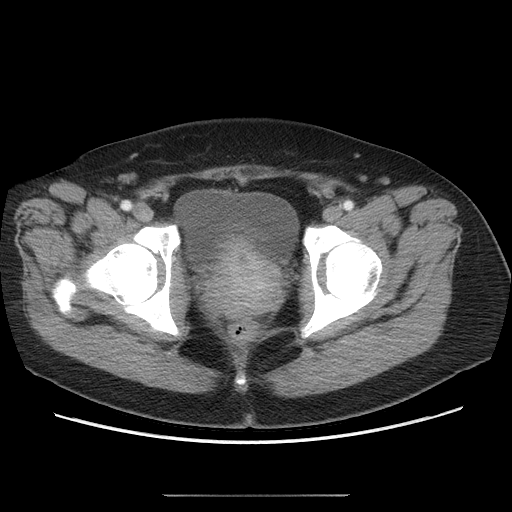
[im 10/89  bone]
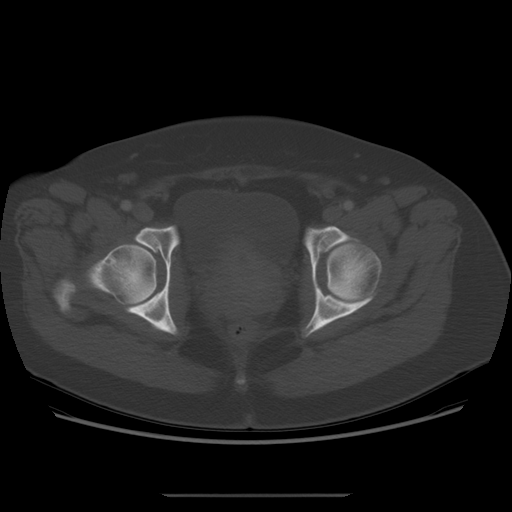
[im 20/89  soft-tissue]
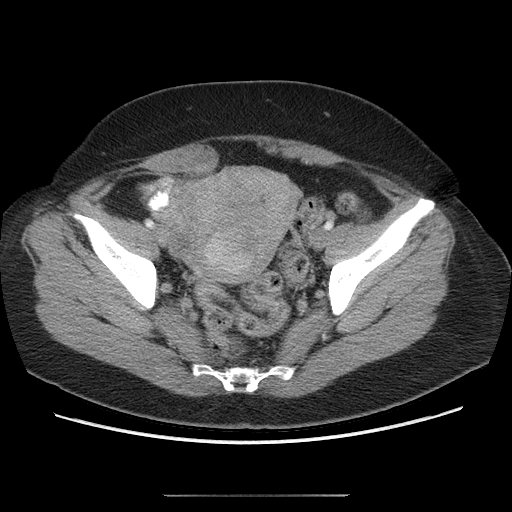
[im 30/89  soft-tissue]
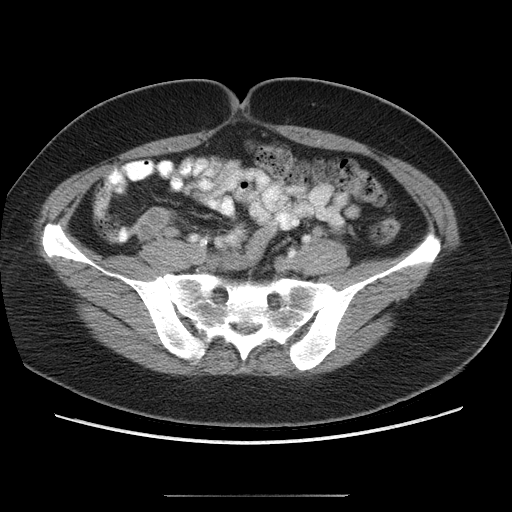
[im 40/89  soft-tissue]
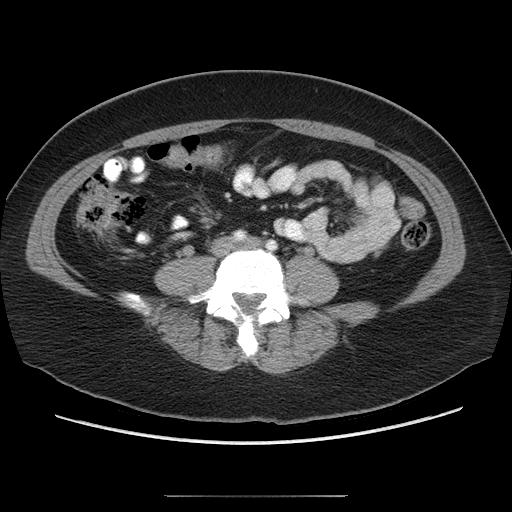
[im 49/89  soft-tissue]
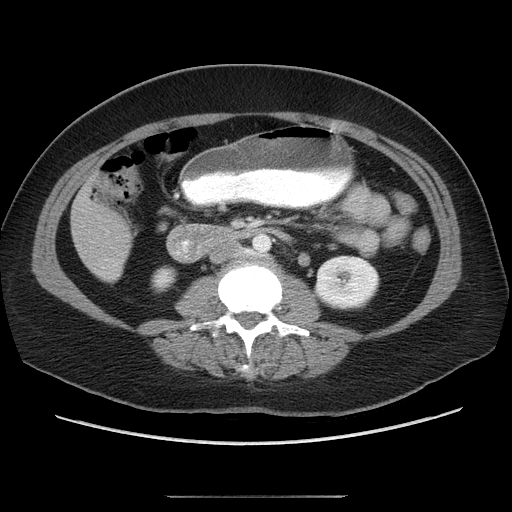
[im 49/89  lung]
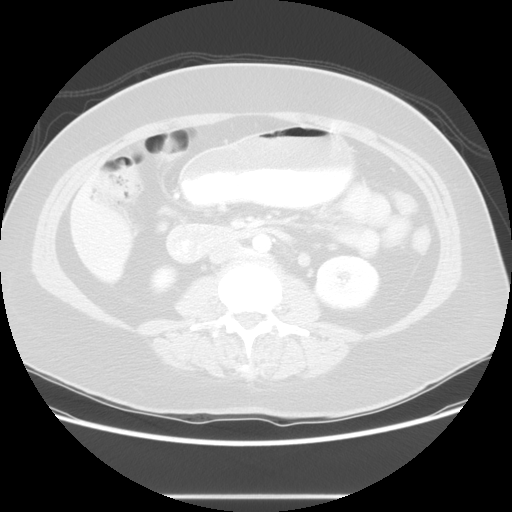
[im 59/89  soft-tissue]
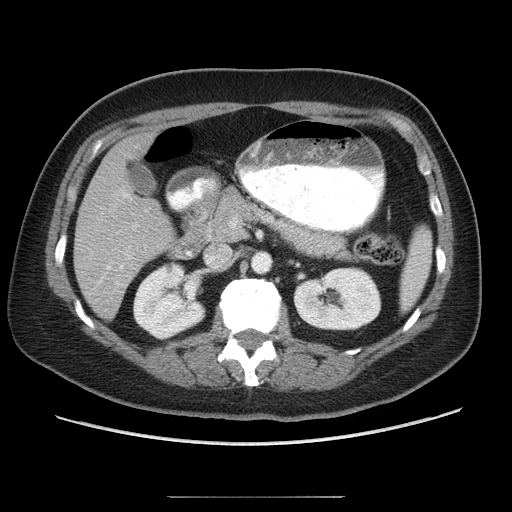
[im 59/89  lung]
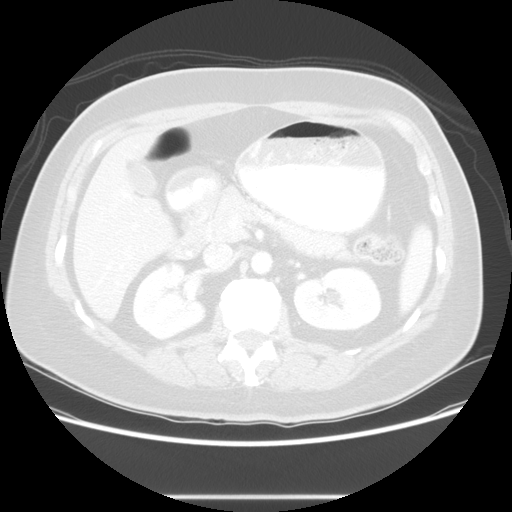
[im 69/89  soft-tissue]
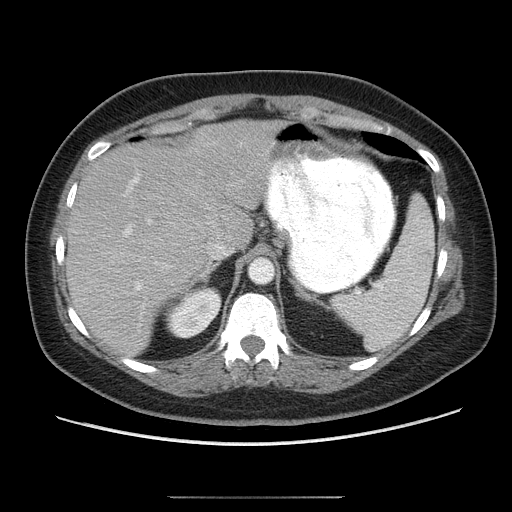
[im 69/89  lung]
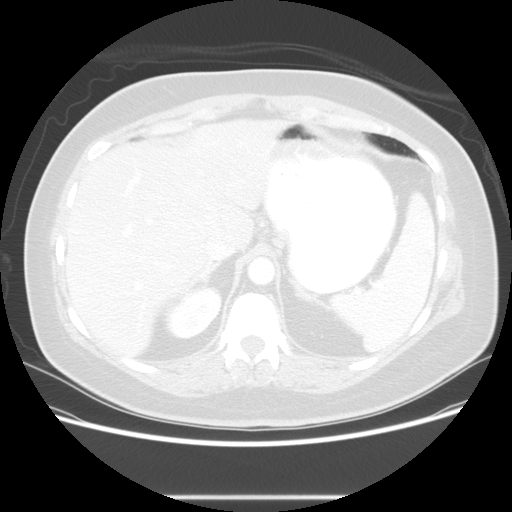
[im 79/89  soft-tissue]
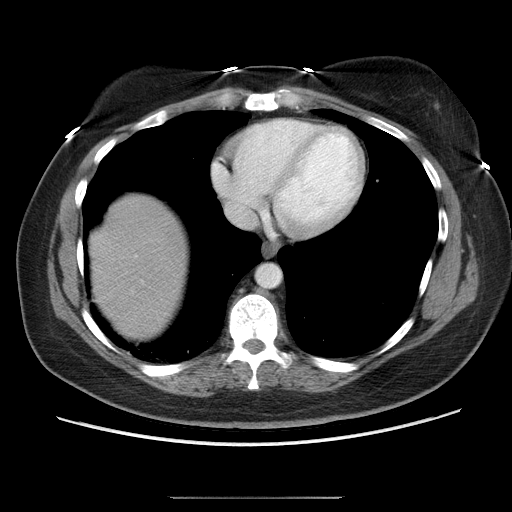
[im 79/89  lung]
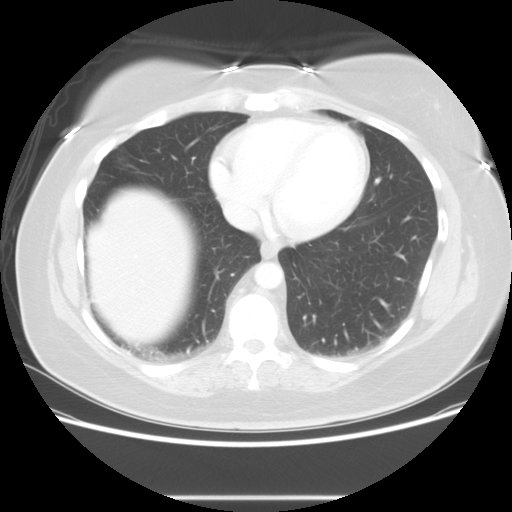

[Series 601: coronal body · coronal · 0.93mm/px · 1 of 118 slices shown, 2 images]
[im 40/118  soft-tissue]
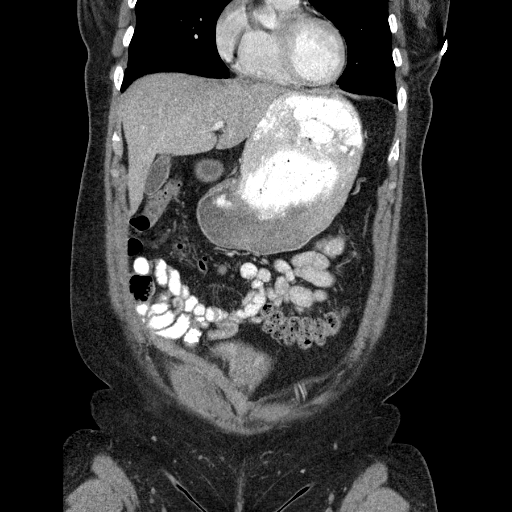
[im 40/118  bone]
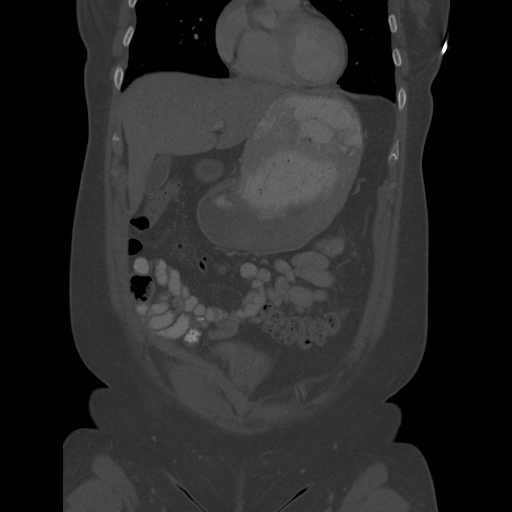

[Series 602: sagittal body · sagittal · 0.93mm/px · 4 of 154 slices shown]
[im 10/154  soft-tissue]
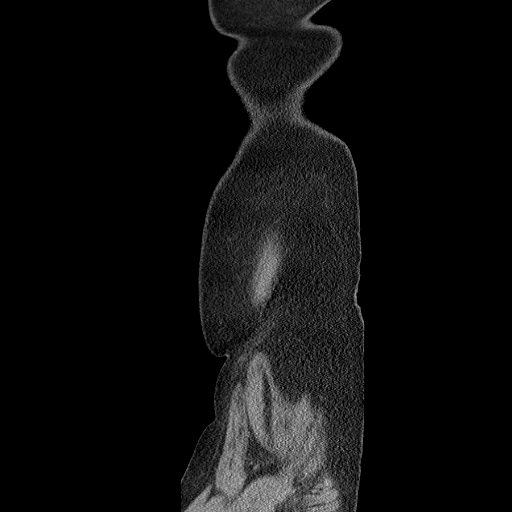
[im 29/154  soft-tissue]
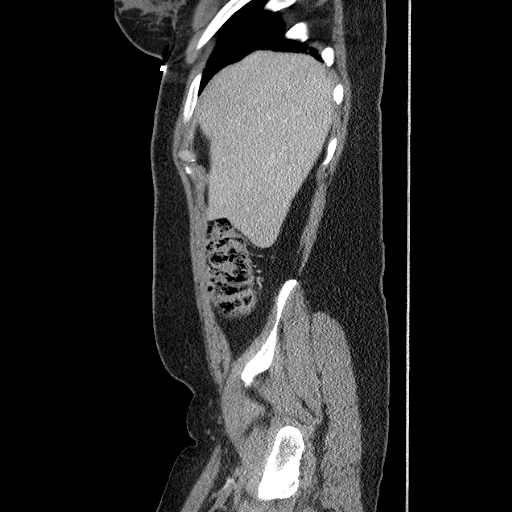
[im 48/154  soft-tissue]
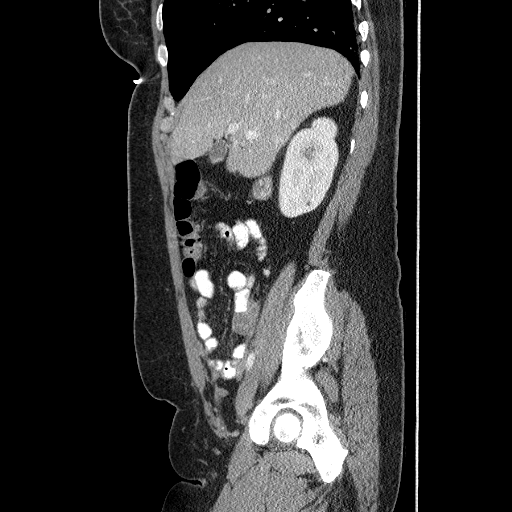
[im 67/154  soft-tissue]
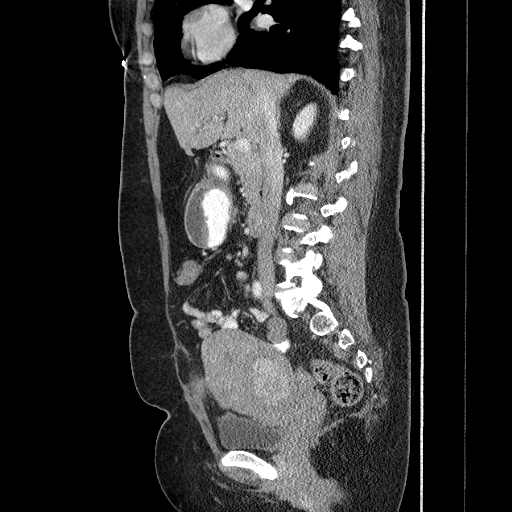

[13 of 36 positions shown; findings below may reference images not displayed]

FINDINGS: Lung bases show minimal dependent atelectasis
bilaterally.  Heart size normal.  No pericardial or pleural
effusion.

Mild irregularity along the lateral margin of the right hepatic
lobe (image 33) is unchanged from 01/28/2010.  Liver, gallbladder,
and adrenal glands are otherwise unremarkable.  A sub centimeter
low attenuation lesion in the interpolar right kidney is unchanged.
Kidneys, spleen, pancreas, stomach and small bowel are
unremarkable.  There is polypoid soft tissue density in the cecum
(image 57).  Colon is otherwise unremarkable.

Retroaortic left renal vein is incidentally noted.  No
pathologically enlarged lymph nodes.  No free fluid.  Rounded
lesions are seen in the uterus.  Ovaries are visualized. No ventral
wall hernia.  No worrisome lytic or sclerotic lesions.
IMPRESSION: 1.  No findings to explain the patient's periumbilical pain.
Specifically, no hernia.
2.  Polypoid soft tissue in the cecum may represent stool.  A true
polyp could also have this appearance.  Correlation with
colonoscopy would likely be helpful in further evaluation, as
clinically indicated. These results will be called to the ordering
clinician or representative by the Radiologist Assistant, and
communication documented in the PACS Dashboard.
3.  Uterine fibroids.

## 2014-08-19 NOTE — Op Note (Signed)
PATIENT NAME:  Jade White, Jade White MR#:  [id number] DATE OF BIRTH:  03/14/1966  DATE OF PROCEDURE:  03/22/2012  PREOPERATIVE DIAGNOSIS: Severe pelvic pain unresponsive to medical management with heavy periods.   POSTOPERATIVE DIAGNOSIS: Severe pelvic pain unresponsive to medical management with heavy periods.  PROCEDURES:  1. Laparoscopic supracervical hysterectomy.  2. Lysis of severe anterior abdominal wall adhesions. 3. Peritoneal biopsies. 4. Left ovarian biopsy.   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   PROCEDURE: The patient was taken to the operating room and placed in supine position. After adequate general endotracheal anesthesia was instilled, the patient was prepped and draped in the usual sterile fashion. Time-out was performed. The patient was placed in [place] stirrups, prepped and draped in the usual sterile fashion. A side-opening speculum was placed in the patient's vagina and the anterior lip of the cervix was grasped with a single-tooth tenaculum. The [place] tenaculum was placed into the uterus and the side-opening speculum was removed from the patient's vagina. The umbilicus was injected with Marcaine. Incision was made. Veress needle was placed. Hang drop test, fluid instillation test, fluid aspiration test showed proper placement of Veress needle. CO2 was placed on low flow. When tympany was heard around the liver, CO2 was placed on high flow. The Veress needle was then removed and Xcel trocars placed under direct visualization. CO2 was attached to this. The aforementioned findings were seen. Right and left 5 mm trocar port was placed into the abdomen in the  left and right lower quadrants. Harmonic scalpel was placed through the left lower quadrant and the omentum was released with cautery from the anterior abdominal wall adhesions. The Harmonic scalpel was then used to cut through the severe anterior abdominal wall adhesions of the uterus. This was carried down to the bladder. Due to distortion of the anatomy due to the severe adhesions, this was done in stages. Attention was then turned to the right and the left ovary, right and left tubes. The tubes were grasped first on the right then the left and the Harmonic scalpel was used to detach the tube from the peritoneum. The patient's tube on the left was found to have a hydrosalpinx. This was sharply and bluntly dissected away from the ovary and from the IP ligament. The round ligament was grasped, cut, and ligated with the Harmonic scalpel. Incision was made down to the bladder. Bladder was found to be grossly adherent to the uterus. Small incisions were made to remove the bladder and fat from the lower uterine segment. Indigo carmine was given. Attention was then turned to the right side where the right tube was then removed. Round ligament was grasped, cut, and bladder flap was attempted to be created. There was a large fibroid in the posterior aspect of the uterus. There was also distorted anatomy as well as the anterior bladder fibroid that was distorting the anatomy. Slow serial bites were taken down the sides of the uterus. The uterine was identified, grasped with the Kleppinger, and cauterized. Slow bites were then made through the uterine artery bilaterally. The bladder was pushed off the lower uterine segment and the Harmonic scalpel was then used to cut across the junction of  the cervix and the uterus. Good hemostasis was identified. The os was identified and cauterized with the Kleppinger. One area of bleeding on the right sidewall was cauterized with the Kleppinger. The pelvis was irrigated with copious  amounts of warm normal saline. The lower trocar on the left lower quadrant was then removed and the morcellator was placed into the skin incision after it had been extended with a scalpel. The attention was then turned to the morcellator and the uterus was grasped with the morcellator and the fibroid and uterus were pulled through the morcellator. Almost the entire uterus was removed in one cutting. The pelvis was then copiously irrigated. The aforementioned findings were seen. The anterior abdominal wall large mass of endometriosis was removed from the anterior abdominal wall sharply. The appearing round mass on the ovary was also biopsied. These were sent to pathology. Photographs were taken of the massive amounts of endometriosis. The patient was taken out of Trendelenburg. Interceed was placed across the cervix opening. Good hemostasis was identified. No blue dye  was in the belly. Urine was seen in the Foley catheter. The patient was laid supine and the cone was used to close the abdominal incision on the left. UR-6 was used to close the umbilicus. 4-0 Monocryl was used to approximate the skin edges. Dermabond was placed. Tegaderm was placed. Hulka tenaculum was removed from the patient's vagina. Good hemostasis was identified. The patient was laid supine and taken to recovery after having tolerated the procedure well.   ____________________________ Elliot Gurneyarrie C. Bain Whichard, MD cck:drc D: 03/26/2012 23:50:31 ET T: 03/27/2012 10:13:20 ET JOB#: [id number] Trevor IhaARRIE C Vannia Pola MD ELECTRONICALLY SIGNED 03/27/2012 13:03
